# Patient Record
Sex: Female | Born: 1940 | Race: White | Hispanic: No | Marital: Married | Smoking: Never smoker
Health system: Southern US, Community
[De-identification: ages and names within clinical notes are randomized; demographics above are authoritative.]

## PROBLEM LIST (undated history)

## (undated) DIAGNOSIS — E559 Vitamin D deficiency, unspecified: Secondary | ICD-10-CM

## (undated) DIAGNOSIS — L409 Psoriasis, unspecified: Secondary | ICD-10-CM

## (undated) DIAGNOSIS — I499 Cardiac arrhythmia, unspecified: Secondary | ICD-10-CM

## (undated) DIAGNOSIS — T7840XA Allergy, unspecified, initial encounter: Secondary | ICD-10-CM

## (undated) DIAGNOSIS — I10 Essential (primary) hypertension: Secondary | ICD-10-CM

## (undated) DIAGNOSIS — M1990 Unspecified osteoarthritis, unspecified site: Secondary | ICD-10-CM

## (undated) DIAGNOSIS — I4891 Unspecified atrial fibrillation: Secondary | ICD-10-CM

## (undated) DIAGNOSIS — E785 Hyperlipidemia, unspecified: Secondary | ICD-10-CM

## (undated) DIAGNOSIS — Z950 Presence of cardiac pacemaker: Secondary | ICD-10-CM

## (undated) HISTORY — DX: Presence of cardiac pacemaker: Z95.0

## (undated) HISTORY — DX: Psoriasis, unspecified: L40.9

## (undated) HISTORY — DX: Hyperlipidemia, unspecified: E78.5

## (undated) HISTORY — DX: Allergy, unspecified, initial encounter: T78.40XA

## (undated) HISTORY — PX: APPENDECTOMY: SHX54

## (undated) HISTORY — DX: Unspecified atrial fibrillation: I48.91

## (undated) HISTORY — DX: Cardiac arrhythmia, unspecified: I49.9

## (undated) HISTORY — PX: TONSILLECTOMY: SUR1361

## (undated) HISTORY — DX: Essential (primary) hypertension: I10

## (undated) HISTORY — PX: TUBAL LIGATION: SHX77

## (undated) HISTORY — DX: Vitamin D deficiency, unspecified: E55.9

## (undated) HISTORY — DX: Unspecified osteoarthritis, unspecified site: M19.90

---

## 2018-06-16 DIAGNOSIS — Z950 Presence of cardiac pacemaker: Secondary | ICD-10-CM | POA: Insufficient documentation

## 2019-10-04 ENCOUNTER — Other Ambulatory Visit: Payer: Self-pay

## 2019-10-04 ENCOUNTER — Other Ambulatory Visit: Payer: Self-pay | Admitting: Orthopaedic Surgery

## 2019-10-04 ENCOUNTER — Encounter: Payer: Self-pay | Admitting: Orthopaedic Surgery

## 2019-10-04 ENCOUNTER — Ambulatory Visit: Payer: Medicare HMO | Admitting: Orthopaedic Surgery

## 2019-10-04 ENCOUNTER — Ambulatory Visit: Payer: Medicare HMO

## 2019-10-04 VITALS — BP 148/80 | HR 64 | Ht 67.0 in | Wt 173.0 lb

## 2019-10-04 DIAGNOSIS — I4811 Longstanding persistent atrial fibrillation: Secondary | ICD-10-CM | POA: Diagnosis not present

## 2019-10-04 DIAGNOSIS — Z7901 Long term (current) use of anticoagulants: Secondary | ICD-10-CM | POA: Diagnosis not present

## 2019-10-04 DIAGNOSIS — I4891 Unspecified atrial fibrillation: Secondary | ICD-10-CM | POA: Insufficient documentation

## 2019-10-04 DIAGNOSIS — Z950 Presence of cardiac pacemaker: Secondary | ICD-10-CM

## 2019-10-04 DIAGNOSIS — M25562 Pain in left knee: Secondary | ICD-10-CM

## 2019-10-04 DIAGNOSIS — G8929 Other chronic pain: Secondary | ICD-10-CM

## 2019-10-04 NOTE — Progress Notes (Signed)
Subjective:    Patient ID: Sheri Watts, female    DOB: 1940-08-18, 79 y.o.   MRN: [id number]  HPI She has had pain in the left knee getting much worse over the last few months.  She has swelling and pain.  She has feeling of giving way.  Her pain is medial.  She is in a wheelchair today because it is so hard for her to walk about.  She has no trauma, no redness.  I have read her notes from [place].    She says nothing has helped her left knee.  She has tried ice, Tylenol and heat.  She cannot take NSAIDs as she is anticoagulated for atrial fib.  She has a pacemaker as well.  She is concerned she is not getting any better.   Review of Systems  Constitutional: Positive for activity change.  Respiratory: Positive for shortness of breath.   Cardiovascular: Positive for chest pain and palpitations.  Endocrine: Positive for cold intolerance and heat intolerance.  Musculoskeletal: Positive for arthralgias, gait problem and joint swelling.  Allergic/Immunologic: Positive for environmental allergies.  All other systems reviewed and are negative.  For Review of Systems, all other systems reviewed and are negative.  The following is a summary of the past history medically, past history surgically, known current medicines, social history and family history.  This information is gathered electronically by the computer from prior information and documentation.  I review this each visit and have found including this information at this point in the chart is beneficial and informative.   Past Medical History:  Diagnosis Date  . Allergies   . Arthritis   . Atrial fibrillation ([place])   . Cardiac arrhythmia   . Hyperlipemia   . Hypertension   . Pacemaker   . Psoriasis   . Vitamin D deficiency     Past Surgical History:  Procedure Laterality Date  . APPENDECTOMY    . TONSILLECTOMY    . TUBAL LIGATION      Current Outpatient Medications on File Prior to Visit  Medication  Sig Dispense Refill  . apixaban (ELIQUIS) 5 MG TABS tablet Take 5 mg by mouth 2 (two) times daily.    Marland Kitchen atorvastatin (LIPITOR) 40 MG tablet Take by mouth.    . Calcium Carb-Cholecalciferol 600-800 MG-UNIT CHEW Chew by mouth.    . Coenzyme Q10 200 MG capsule Take by mouth.    . diclofenac Sodium (VOLTAREN) 1 % GEL     . lisinopril (ZESTRIL) 10 MG tablet Take 10 mg by mouth daily.    Marland Kitchen loratadine (CLARITIN) 10 MG tablet Take 10 mg by mouth daily.    . Multiple Vitamins-Minerals (MULTIPLE VITAMINS/WOMENS PO) Take by mouth daily.     No current facility-administered medications on file prior to visit.    Social History   Socioeconomic History  . Marital status: Single    Spouse name: Not on file  . Number of children: Not on file  . Years of education: Not on file  . Highest education level: Not on file  Occupational History  . Not on file  Tobacco Use  . Smoking status: Never Smoker  . Smokeless tobacco: Never Used  Substance and Sexual Activity  . Alcohol use: Never  . Drug use: Never  . Sexual activity: Not on file  Other Topics Concern  . Not on file  Social History Narrative  . Not on file   Social Determinants of Health   Financial Resource  Strain:   . Difficulty of Paying Living Expenses:   Food Insecurity:   . Worried About Charity fundraiser in the Last Year:   . Arboriculturist in the Last Year:   Transportation Needs:   . Film/video editor (Medical):   Marland Kitchen Lack of Transportation (Non-Medical):   Physical Activity:   . Days of Exercise per Week:   . Minutes of Exercise per Session:   Stress:   . Feeling of Stress :   Social Connections:   . Frequency of Communication with Friends and Family:   . Frequency of Social Gatherings with Friends and Family:   . Attends Religious Services:   . Active Member of Clubs or Organizations:   . Attends Archivist Meetings:   Marland Kitchen Marital Status:   Intimate Partner Violence:   . Fear of Current or  Ex-Partner:   . Emotionally Abused:   Marland Kitchen Physically Abused:   . Sexually Abused:     Family History  Problem Relation Age of Onset  . Leukemia Mother   . Pleurisy Father   . Rheum arthritis Sister   . Kidney failure Sister     BP (!) 148/80   Pulse 64   Ht 5\' 7"  (1.702 m)   Wt 173 lb (78.5 kg)   BMI 27.10 kg/m   Body mass index is 27.1 kg/m.     Objective:   Physical Exam Vitals and nursing note reviewed.  Constitutional:      Appearance: She is well-developed.  HENT:     Head: Normocephalic and atraumatic.  Eyes:     Conjunctiva/sclera: Conjunctivae normal.     Pupils: Pupils are equal, round, and reactive to light.  Cardiovascular:     Rate and Rhythm: Normal rate and regular rhythm.  Pulmonary:     Effort: Pulmonary effort is normal.  Abdominal:     Palpations: Abdomen is soft.  Musculoskeletal:     Cervical back: Normal range of motion and neck supple.       Legs:  Skin:    General: Skin is warm and dry.  Neurological:     Mental Status: She is alert and oriented to person, place, and time.     Cranial Nerves: No cranial nerve deficit.     Motor: No abnormal muscle tone.     Coordination: Coordination normal.     Deep Tendon Reflexes: Reflexes are normal and symmetric. Reflexes normal.  Psychiatric:        Behavior: Behavior normal.        Thought Content: Thought content normal.        Judgment: Judgment normal.    X-rays were done of the left knee, reported separately.       Assessment & Plan:   Encounter Diagnoses  Name Primary?  . Chronic pain of left knee Yes  . Pacemaker   . Longstanding persistent atrial fibrillation ([place])   . Current use of long term anticoagulation    PROCEDURE NOTE:  The patient requests injections of the left knee , verbal consent was obtained.  The left knee was prepped appropriately after time out was performed.   Sterile technique was observed and injection of 1 cc of Depo-Medrol 40 mg with several cc's  of plain xylocaine. Anesthesia was provided by ethyl chloride and a 20-gauge needle was used to inject the knee area. The injection was tolerated well.  A band aid dressing was applied.  The patient was advised to apply  ice later today and tomorrow to the injection sight as needed.  I am concerned about medial meniscus tear.  She is wearing down the medial knee with narrowing and DJD present.  I will see her in two weeks.  She may need CT arthrogram of the left knee.  Call if any problem.  Precautions discussed.   Electronically Signed Sanjuana Kava, MD 6/8/20211:26 PM

## 2019-10-18 ENCOUNTER — Encounter: Payer: Self-pay | Admitting: Orthopaedic Surgery

## 2019-10-18 ENCOUNTER — Other Ambulatory Visit: Payer: Self-pay

## 2019-10-18 ENCOUNTER — Ambulatory Visit (INDEPENDENT_AMBULATORY_CARE_PROVIDER_SITE_OTHER): Payer: Medicare HMO | Admitting: Orthopaedic Surgery

## 2019-10-18 VITALS — BP 148/62 | HR 71 | Ht 67.0 in | Wt 173.0 lb

## 2019-10-18 DIAGNOSIS — G8929 Other chronic pain: Secondary | ICD-10-CM

## 2019-10-18 DIAGNOSIS — M25562 Pain in left knee: Secondary | ICD-10-CM | POA: Diagnosis not present

## 2019-10-18 DIAGNOSIS — Z7901 Long term (current) use of anticoagulants: Secondary | ICD-10-CM

## 2019-10-18 DIAGNOSIS — I4811 Longstanding persistent atrial fibrillation: Secondary | ICD-10-CM

## 2019-10-18 DIAGNOSIS — Z950 Presence of cardiac pacemaker: Secondary | ICD-10-CM

## 2019-10-18 NOTE — Progress Notes (Signed)
PROCEDURE NOTE:  The patient requests injections of the left knee , verbal consent was obtained.  The left knee was prepped appropriately after time out was performed.   Sterile technique was observed and injection of 1 cc of Depo-Medrol 40 mg with several cc's of plain xylocaine. Anesthesia was provided by ethyl chloride and a 20-gauge needle was used to inject the knee area. The injection was tolerated well.  A band aid dressing was applied.  The patient was advised to apply ice later today and tomorrow to the injection sight as needed.  I will get MRI of the left knee at [facility] where it has unit that allows her type of pacemaker to have MRI.  Return in two weeks.  Call if any problem.  Precautions discussed.   Electronically Signed Sanjuana Kava, MD 6/22/202110:18 AM

## 2019-10-18 NOTE — Addendum Note (Signed)
Addended by: Derek Mound A on: 10/18/2019 10:21 AM   Modules accepted: Orders

## 2019-11-03 ENCOUNTER — Ambulatory Visit: Payer: Medicare HMO | Admitting: Orthopaedic Surgery

## 2019-11-15 ENCOUNTER — Ambulatory Visit (HOSPITAL_COMMUNITY)
Admission: RE | Admit: 2019-11-15 | Discharge: 2019-11-15 | Disposition: A | Discharge status: Home / Self Care | Payer: Medicare HMO | Source: Ambulatory Visit | Attending: Orthopaedic Surgery | Admitting: Orthopaedic Surgery

## 2019-11-15 ENCOUNTER — Ambulatory Visit (HOSPITAL_COMMUNITY)
Admission: RE | Admit: 2019-11-15 | Discharge: 2019-11-15 | Disposition: A | Discharge status: Home / Self Care | Payer: Medicare HMO | Source: Ambulatory Visit | Attending: Student | Admitting: Student

## 2019-11-15 ENCOUNTER — Other Ambulatory Visit: Payer: Self-pay

## 2019-11-15 DIAGNOSIS — G8929 Other chronic pain: Secondary | ICD-10-CM | POA: Insufficient documentation

## 2019-11-15 DIAGNOSIS — Z950 Presence of cardiac pacemaker: Secondary | ICD-10-CM | POA: Diagnosis present

## 2019-11-15 DIAGNOSIS — M25562 Pain in left knee: Secondary | ICD-10-CM | POA: Diagnosis not present

## 2019-11-15 NOTE — Progress Notes (Signed)
Per order:  Pt will not require any changes prior to MRI. (Discussed personally with [employer]).    [employer] Rep will walk RN through clearing device post MRI.

## 2019-11-15 NOTE — Progress Notes (Signed)
Informed of MRI for today.   Device system confirmed to be MRI conditional, with implant date > 6 weeks ago and no evidence of abandoned or epicardial leads in review of most recent CXR Interrogation from today reviewed, pt is currently AP-VS at ~60 bpm  Pt will not require any changes prior to MRI. (Discussed personally with [employer]).   [employer] Rep will walk RN through clearing device post MRI.   Shirley Friar, PA-C  11/15/2019 1:35 PM

## 2019-11-17 ENCOUNTER — Other Ambulatory Visit: Payer: Self-pay

## 2019-11-17 ENCOUNTER — Encounter: Payer: Self-pay | Admitting: Orthopaedic Surgery

## 2019-11-17 ENCOUNTER — Ambulatory Visit: Payer: Medicare HMO | Admitting: Orthopaedic Surgery

## 2019-11-17 VITALS — Ht 67.0 in | Wt 173.0 lb

## 2019-11-17 DIAGNOSIS — M1712 Unilateral primary osteoarthritis, left knee: Secondary | ICD-10-CM

## 2019-11-17 DIAGNOSIS — Z950 Presence of cardiac pacemaker: Secondary | ICD-10-CM | POA: Diagnosis not present

## 2019-11-17 DIAGNOSIS — G8929 Other chronic pain: Secondary | ICD-10-CM

## 2019-11-17 DIAGNOSIS — I4811 Longstanding persistent atrial fibrillation: Secondary | ICD-10-CM | POA: Diagnosis not present

## 2019-11-17 DIAGNOSIS — Z7901 Long term (current) use of anticoagulants: Secondary | ICD-10-CM

## 2019-11-17 NOTE — Progress Notes (Signed)
Patient CW:UGQBV Sheri Watts, female DOB:09-Jul-1940, 79 y.o. QXI:[id number]  Chief Complaint  Patient presents with  . Knee Pain    Lt knee    HPI  Emonnie Watts is a 79 y.o. female who has left knee pain.  She had a MRI which showed: IMPRESSION: 1. 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 No acute osseous findings. 2. Meniscal degeneration with free edge fraying and partial peripheral extrusion of the medial meniscus from the joint. Intact meniscal root without centrally displaced meniscal fragment. 3. The lateral meniscus, cruciate and collateral ligaments are intact. 4. Small mildly complex joint effusion and moderate-sized Baker'scyst.  I have explained the findings to her.  I would not recommend arthroscopy.  She is a candidate for a total knee.  She will think about this.  She has less pain since the injection.   Body mass index is 27.1 kg/m.  ROS  Review of Systems  Constitutional: Positive for activity change.  Respiratory: Positive for shortness of breath.   Cardiovascular: Positive for chest pain and palpitations.  Endocrine: Positive for cold intolerance and heat intolerance.  Musculoskeletal: Positive for arthralgias, gait problem and joint swelling.  Allergic/Immunologic: Positive for environmental allergies.  All other systems reviewed and are negative.   All other systems reviewed and are negative.  The following is a summary of the past history medically, past history surgically, known current medicines, social history and family history.  This information is gathered electronically by the computer from prior information and documentation.  I review this each visit and have found including this information at this point in the chart is beneficial and informative.    Past Medical History:  Diagnosis Date  . Allergies   . Arthritis   . Atrial fibrillation ([place])   . Cardiac arrhythmia   . Hyperlipemia   . Hypertension   .  Pacemaker   . Psoriasis   . Vitamin D deficiency     Past Surgical History:  Procedure Laterality Date  . APPENDECTOMY    . TONSILLECTOMY    . TUBAL LIGATION      Family History  Problem Relation Age of Onset  . Leukemia Mother   . Pleurisy Father   . Rheum arthritis Sister   . Kidney failure Sister     Social History Social History   Tobacco Use  . Smoking status: Never Smoker  . Smokeless tobacco: Never Used  Vaping Use  . Vaping Use: Never used  Substance Use Topics  . Alcohol use: Never  . Drug use: Never    Allergies  Allergen Reactions  . Cephalexin   . Ciprofloxacin   . Doxycycline Monohydrate   . Smz-Tmp Ds [Sulfamethoxazole-Trimethoprim]     Current Outpatient Medications  Medication Sig Dispense Refill  . apixaban (ELIQUIS) 5 MG TABS tablet Take 5 mg by mouth 2 (two) times daily.    Marland Kitchen atorvastatin (LIPITOR) 40 MG tablet Take by mouth.    . Calcium Carb-Cholecalciferol 600-800 MG-UNIT CHEW Chew by mouth.    . Coenzyme Q10 200 MG capsule Take by mouth.    . diclofenac Sodium (VOLTAREN) 1 % GEL     . lisinopril (ZESTRIL) 10 MG tablet Take 10 mg by mouth daily.    . Multiple Vitamins-Minerals (MULTIPLE VITAMINS/WOMENS PO) Take by mouth daily.    Marland Kitchen loratadine (CLARITIN) 10 MG tablet Take 10 mg by mouth daily. (Patient not taking: Reported on 11/17/2019)     No current facility-administered medications for this visit.  Physical Exam  Height 5\' 7"  (1.702 m), weight 173 lb (78.5 kg).  Constitutional: overall normal hygiene, normal nutrition, well developed, normal grooming, normal body habitus. Assistive device:walker  Musculoskeletal: gait and station Limp left, muscle tone and strength are normal, no tremors or atrophy is present.  .  Neurological: coordination overall normal.  Deep tendon reflex/nerve stretch intact.  Sensation normal.  Cranial nerves II-XII intact.   Skin:   Normal overall no scars, lesions, ulcers or rashes. No  psoriasis.  Psychiatric: Alert and oriented x 3.  Recent memory intact, remote memory unclear.  Normal mood and affect. Well groomed.  Good eye contact.  Cardiovascular: overall no swelling, no varicosities, no edema bilaterally, normal temperatures of the legs and arms, no clubbing, cyanosis and good capillary refill.  Lymphatic: palpation is normal.  Left knee with effusion, crepitus, ROM 0 to 105, positive Medial [place], NV intact.  All other systems reviewed and are negative   The patient has been educated about the nature of the problem(s) and counseled on treatment options.  The patient appeared to understand what I have discussed and is in agreement with it.  Encounter Diagnoses  Name Primary?  . Chronic pain of left knee Yes  . Pacemaker   . Longstanding persistent atrial fibrillation ([place])   . Current use of long term anticoagulation     PLAN Call if any problems.  Precautions discussed.  Continue current medications.   Return to clinic 1 month   Electronically Signed Sanjuana Kava, MD 7/22/202111:09 AM

## 2019-12-15 ENCOUNTER — Encounter: Payer: Self-pay | Admitting: Orthopaedic Surgery

## 2019-12-15 ENCOUNTER — Other Ambulatory Visit: Payer: Self-pay

## 2019-12-15 ENCOUNTER — Ambulatory Visit (INDEPENDENT_AMBULATORY_CARE_PROVIDER_SITE_OTHER): Payer: Medicare HMO | Admitting: Orthopaedic Surgery

## 2019-12-15 VITALS — BP 151/59 | HR 63 | Ht 67.0 in | Wt 173.2 lb

## 2019-12-15 DIAGNOSIS — Z7901 Long term (current) use of anticoagulants: Secondary | ICD-10-CM | POA: Diagnosis not present

## 2019-12-15 DIAGNOSIS — G8929 Other chronic pain: Secondary | ICD-10-CM

## 2019-12-15 DIAGNOSIS — I4811 Longstanding persistent atrial fibrillation: Secondary | ICD-10-CM

## 2019-12-15 DIAGNOSIS — Z950 Presence of cardiac pacemaker: Secondary | ICD-10-CM

## 2019-12-15 DIAGNOSIS — M25562 Pain in left knee: Secondary | ICD-10-CM | POA: Diagnosis not present

## 2019-12-15 NOTE — Progress Notes (Signed)
Patient [name], female DOB:[birth year], 79 y.o. PIR:[id number]  Chief Complaint  Patient presents with  . Knee Pain    L/doing good/walking alot bothers it    HPI  Sheri Watts is a 79 y.o. female who has significant DJD of the left knee.  She is on anticoagulation.  I cannot give NSAIDs.  She is using a walker.  The injection did help and she would prefer not to consider a total knee at this time.  I have encouraged her to be as active as possible.  I can see her as needed to consider another injection as needed.   Body mass index is 27.13 kg/m.  ROS  Review of Systems  Constitutional: Positive for activity change.  Respiratory: Positive for shortness of breath.   Cardiovascular: Positive for chest pain and palpitations.  Endocrine: Positive for cold intolerance and heat intolerance.  Musculoskeletal: Positive for arthralgias, gait problem and joint swelling.  Allergic/Immunologic: Positive for environmental allergies.  All other systems reviewed and are negative.   All other systems reviewed and are negative.  The following is a summary of the past history medically, past history surgically, known current medicines, social history and family history.  This information is gathered electronically by the computer from prior information and documentation.  I review this each visit and have found including this information at this point in the chart is beneficial and informative.    Past Medical History:  Diagnosis Date  . Allergies   . Arthritis   . Atrial fibrillation ([place])   . Cardiac arrhythmia   . Hyperlipemia   . Hypertension   . Pacemaker   . Psoriasis   . Vitamin D deficiency     Past Surgical History:  Procedure Laterality Date  . APPENDECTOMY    . TONSILLECTOMY    . TUBAL LIGATION      Family History  Problem Relation Age of Onset  . Leukemia Mother   . Pleurisy Father   . Rheum arthritis Sister   . Kidney failure Sister     Social  History Social History   Tobacco Use  . Smoking status: Never Smoker  . Smokeless tobacco: Never Used  Vaping Use  . Vaping Use: Never used  Substance Use Topics  . Alcohol use: Never  . Drug use: Never    Allergies  Allergen Reactions  . Cephalexin   . Ciprofloxacin   . Doxycycline Monohydrate   . Smz-Tmp Ds [Sulfamethoxazole-Trimethoprim]     Current Outpatient Medications  Medication Sig Dispense Refill  . apixaban (ELIQUIS) 5 MG TABS tablet Take 5 mg by mouth 2 (two) times daily.    Marland Kitchen atorvastatin (LIPITOR) 40 MG tablet Take by mouth.    . Calcium Carb-Cholecalciferol 600-800 MG-UNIT CHEW Chew by mouth.    . Coenzyme Q10 200 MG capsule Take by mouth.    . diclofenac Sodium (VOLTAREN) 1 % GEL     . lisinopril (ZESTRIL) 10 MG tablet Take 10 mg by mouth daily.    Marland Kitchen loratadine (CLARITIN) 10 MG tablet Take 10 mg by mouth daily.     . Multiple Vitamins-Minerals (MULTIPLE VITAMINS/WOMENS PO) Take by mouth daily.     No current facility-administered medications for this visit.     Physical Exam  Blood pressure (!) 151/59, pulse 63, height 5\' 7"  (1.702 m), weight 173 lb 4 oz (78.6 kg).  Constitutional: overall normal hygiene, normal nutrition, well developed, normal grooming, normal body habitus. Assistive device:walker  Musculoskeletal: gait and station Limp  left, muscle tone and strength are normal, no tremors or atrophy is present.  .  Neurological: coordination overall normal.  Deep tendon reflex/nerve stretch intact.  Sensation normal.  Cranial nerves II-XII intact.   Skin:   Normal overall no scars, lesions, ulcers or rashes. No psoriasis.  Psychiatric: Alert and oriented x 3.  Recent memory intact, remote memory unclear.  Normal mood and affect. Well groomed.  Good eye contact.  Cardiovascular: overall no swelling, no varicosities, no edema bilaterally, normal temperatures of the legs and arms, no clubbing, cyanosis and good capillary refill.  Lymphatic:  palpation is normal.  Left knee has effusion, crepitus, ROM 0 to 100, diffusely tender, NV intact.  All other systems reviewed and are negative   The patient has been educated about the nature of the problem(s) and counseled on treatment options.  The patient appeared to understand what I have discussed and is in agreement with it.  Encounter Diagnoses  Name Primary?  . Chronic pain of left knee Yes  . Pacemaker   . Longstanding persistent atrial fibrillation ([place])   . Current use of long term anticoagulation     PLAN Call if any problems.  Precautions discussed.  Continue current medications.   Return to clinic prn   Electronically Signed Sanjuana Kava, MD 8/19/20219:19 AM

## 2020-01-17 NOTE — H&P (Signed)
Surgical History & Physical  Patient Name: Sheri Watts DOB: Nov 02, 1940  Surgery: Cataract extraction with intraocular lens implant phacoemulsification; Left Eye  Surgeon: Baruch Goldmann MD Surgery Date:  01/27/2020 Pre-Op Date:  01/17/2020  HPI: A 79 Yr. old female patient Pt present for cataract evaluation. The patient complains of difficulty when driving due to glare from headlights or sun, which began 3 months ago. Patient states she struggles with distance and near. This is negatively affecting the patient's quality of life. Wearing glasses full time. Both eyes are affected. The episode is constant. Symptoms occur when the patient is driving and reading. The condition is better in dim light. The patient experiences no flashes and no increase floaters. HPI was performed by Baruch Goldmann .  Medical History: Dry Eyes Cataracts Heart Problem High Blood Pressure High cholesterol  Review of Systems Cardiovascular Pacemaker/defibrillator Musculoskeletal Joint Ache, Stiffness, knees All recorded systems are negative except as noted above.  Social   Never smoked   Medication Eliquis, Calcium, Fish Oil, CoQ10, Atorvastatin, Lisinopril, Diclofenac Sodium, Multivitamin,   Sx/Procedures Heart Sx, Tubal Ligation,   Drug Allergies  Ciprofloxacin, Smz/mpds, CEPHALEXIN, Doxyclicline,   History & Physical: Heent:  Cataract, Left eye NECK: supple without bruits LUNGS: lungs clear to auscultation CV: regular rate and rhythm Abdomen: soft and non-tender   Impression & Plan: Assessment: 1.  COMBINED FORMS AGE RELATED CATARACT; Both Eyes (H25.813) 2.  BLEPHARITIS; Right Upper Lid, Right Lower Lid, Left Upper Lid, Left Lower Lid (H01.001, H01.002,H01.004,H01.005) 3.  DERMATOCHALASIS, no surgery; Right Upper Lid, Left Upper Lid (H02.831, H02.834) 4.  ASTIGMATISM, REGULAR; Both Eyes (H52.223)  Plan: 1.  Cataract accounts for the patient's decreased vision. This visual impairment is not  correctable with a tolerable change in glasses or contact lenses. Cataract surgery with an implantation of a new lens should significantly improve the visual and functional status of the patient. Discussed all risks, benefits, alternatives, and potential complications. Discussed the procedures and recovery. Patient desires to have surgery. A-scan ordered and performed today for intra-ocular lens calculations. The surgery will be performed in order to improve vision for driving, reading, and for eye examinations. Recommend phacoemulsification with intra-ocular lens. Recommend Dextenza for post-operative pain and inflammation. Left Eye worse - first. Dilates well - shugarcaine by protocol. Consider Toric Lens. 2.  Begin/continue lid scrubs. 3.  Asymptomatic, recommend observation for now. Findings, prognosis and treatment options reviewed. 4.  Consider toric lens as above.

## 2020-01-25 ENCOUNTER — Encounter (HOSPITAL_COMMUNITY): Payer: Self-pay

## 2020-01-25 ENCOUNTER — Other Ambulatory Visit: Payer: Self-pay

## 2020-01-25 ENCOUNTER — Encounter (HOSPITAL_COMMUNITY)
Admission: RE | Admit: 2020-01-25 | Discharge: 2020-01-25 | Disposition: A | Discharge status: Home / Self Care | Payer: Medicare HMO | Source: Ambulatory Visit | Attending: Ophthalmology | Admitting: Ophthalmology

## 2020-01-25 ENCOUNTER — Other Ambulatory Visit (HOSPITAL_COMMUNITY)
Admission: RE | Admit: 2020-01-25 | Discharge: 2020-01-25 | Disposition: A | Discharge status: Home / Self Care | Payer: Medicare HMO | Source: Ambulatory Visit | Attending: Ophthalmology | Admitting: Ophthalmology

## 2020-01-25 DIAGNOSIS — Z01812 Encounter for preprocedural laboratory examination: Secondary | ICD-10-CM | POA: Diagnosis not present

## 2020-01-25 DIAGNOSIS — Z20822 Contact with and (suspected) exposure to covid-19: Secondary | ICD-10-CM | POA: Insufficient documentation

## 2020-01-25 LAB — SARS CORONAVIRUS 2 (TAT 6-24 HRS): SARS Coronavirus 2: NEGATIVE

## 2020-01-27 ENCOUNTER — Encounter (HOSPITAL_COMMUNITY)
Admission: RE | Disposition: A | Discharge status: Home / Self Care | Payer: Self-pay | Source: Home / Self Care | Attending: Ophthalmology

## 2020-01-27 ENCOUNTER — Ambulatory Visit (HOSPITAL_COMMUNITY): Payer: Medicare HMO | Admitting: Anesthesiology

## 2020-01-27 ENCOUNTER — Ambulatory Visit (HOSPITAL_COMMUNITY)
Admission: RE | Admit: 2020-01-27 | Discharge: 2020-01-27 | Disposition: A | Discharge status: Home / Self Care | Payer: Medicare HMO | Attending: Ophthalmology | Admitting: Ophthalmology

## 2020-01-27 ENCOUNTER — Encounter (HOSPITAL_COMMUNITY): Payer: Self-pay | Admitting: Ophthalmology

## 2020-01-27 ENCOUNTER — Other Ambulatory Visit: Payer: Self-pay

## 2020-01-27 DIAGNOSIS — I4891 Unspecified atrial fibrillation: Secondary | ICD-10-CM | POA: Insufficient documentation

## 2020-01-27 DIAGNOSIS — H0100A Unspecified blepharitis right eye, upper and lower eyelids: Secondary | ICD-10-CM | POA: Insufficient documentation

## 2020-01-27 DIAGNOSIS — H0100B Unspecified blepharitis left eye, upper and lower eyelids: Secondary | ICD-10-CM | POA: Diagnosis not present

## 2020-01-27 DIAGNOSIS — Z7901 Long term (current) use of anticoagulants: Secondary | ICD-10-CM | POA: Insufficient documentation

## 2020-01-27 DIAGNOSIS — H52223 Regular astigmatism, bilateral: Secondary | ICD-10-CM | POA: Diagnosis not present

## 2020-01-27 DIAGNOSIS — E7800 Pure hypercholesterolemia, unspecified: Secondary | ICD-10-CM | POA: Insufficient documentation

## 2020-01-27 DIAGNOSIS — Z79899 Other long term (current) drug therapy: Secondary | ICD-10-CM | POA: Insufficient documentation

## 2020-01-27 DIAGNOSIS — H02834 Dermatochalasis of left upper eyelid: Secondary | ICD-10-CM | POA: Insufficient documentation

## 2020-01-27 DIAGNOSIS — Z95810 Presence of automatic (implantable) cardiac defibrillator: Secondary | ICD-10-CM | POA: Insufficient documentation

## 2020-01-27 DIAGNOSIS — H02831 Dermatochalasis of right upper eyelid: Secondary | ICD-10-CM | POA: Insufficient documentation

## 2020-01-27 DIAGNOSIS — I10 Essential (primary) hypertension: Secondary | ICD-10-CM | POA: Insufficient documentation

## 2020-01-27 DIAGNOSIS — H25813 Combined forms of age-related cataract, bilateral: Secondary | ICD-10-CM | POA: Diagnosis not present

## 2020-01-27 HISTORY — PX: CATARACT EXTRACTION W/PHACO: SHX586

## 2020-01-27 SURGERY — PHACOEMULSIFICATION, CATARACT, WITH IOL INSERTION
Anesthesia: Monitor Anesthesia Care | Site: Eye | Laterality: Left

## 2020-01-27 MED ORDER — EPINEPHRINE PF 1 MG/ML IJ SOLN
INTRAOCULAR | Status: DC | PRN
  Administered 2020-01-27: 500 mL

## 2020-01-27 MED ORDER — MIDAZOLAM HCL 5 MG/5ML IJ SOLN
INTRAMUSCULAR | Status: DC | PRN
  Administered 2020-01-27: .5 mg via INTRAVENOUS

## 2020-01-27 MED ORDER — POVIDONE-IODINE 5 % OP SOLN
OPHTHALMIC | Status: DC | PRN
  Administered 2020-01-27: 1 via OPHTHALMIC

## 2020-01-27 MED ORDER — LACTATED RINGERS IV SOLN: INTRAVENOUS | Status: DC

## 2020-01-27 MED ORDER — CYCLOPENTOLATE-PHENYLEPHRINE 0.2-1 % OP SOLN
1.0000 [drp] | OPHTHALMIC | Status: AC | PRN
  Administered 2020-01-27 (×3): 1 [drp] via OPHTHALMIC

## 2020-01-27 MED ORDER — BSS IO SOLN
INTRAOCULAR | Status: DC | PRN
  Administered 2020-01-27: 15 mL via INTRAOCULAR

## 2020-01-27 MED ORDER — MIDAZOLAM HCL 2 MG/2ML IJ SOLN
INTRAMUSCULAR | Status: AC
  Filled 2020-01-27: qty 2

## 2020-01-27 MED ORDER — PHENYLEPHRINE HCL 2.5 % OP SOLN
1.0000 [drp] | OPHTHALMIC | Status: AC | PRN
  Administered 2020-01-27 (×3): 1 [drp] via OPHTHALMIC

## 2020-01-27 MED ORDER — EPINEPHRINE PF 1 MG/ML IJ SOLN
INTRAMUSCULAR | Status: AC
  Filled 2020-01-27: qty 2

## 2020-01-27 MED ORDER — PROVISC 10 MG/ML IO SOLN
INTRAOCULAR | Status: DC | PRN
  Administered 2020-01-27: 0.85 mL via INTRAOCULAR

## 2020-01-27 MED ORDER — TETRACAINE HCL 0.5 % OP SOLN
1.0000 [drp] | OPHTHALMIC | Status: AC | PRN
  Administered 2020-01-27 (×3): 1 [drp] via OPHTHALMIC

## 2020-01-27 MED ORDER — SODIUM HYALURONATE 23 MG/ML IO SOLN
INTRAOCULAR | Status: DC | PRN
  Administered 2020-01-27: 0.6 mL via INTRAOCULAR

## 2020-01-27 MED ORDER — LIDOCAINE HCL (PF) 1 % IJ SOLN
INTRAOCULAR | Status: DC | PRN
  Administered 2020-01-27: 1 mL via OPHTHALMIC

## 2020-01-27 MED ORDER — LIDOCAINE HCL 3.5 % OP GEL
1.0000 "application " | Freq: Once | OPHTHALMIC | Status: AC
  Administered 2020-01-27: 1 via OPHTHALMIC

## 2020-01-27 SURGICAL SUPPLY — 14 items
CLOTH BEACON ORANGE TIMEOUT ST (SAFETY) ×3 IMPLANT
EYE SHIELD UNIVERSAL CLEAR (GAUZE/BANDAGES/DRESSINGS) ×3 IMPLANT
GLOVE BIOGEL PI IND STRL 6.5 (GLOVE) ×1 IMPLANT
GLOVE BIOGEL PI IND STRL 7.0 (GLOVE) ×1 IMPLANT
GLOVE BIOGEL PI INDICATOR 6.5 (GLOVE) ×2
GLOVE BIOGEL PI INDICATOR 7.0 (GLOVE) ×2
LENS ALC ACRYL/TECN (Ophthalmic Related) ×3 IMPLANT
NEEDLE HYPO 18GX1.5 BLUNT FILL (NEEDLE) ×3 IMPLANT
PAD ARMBOARD 7.5X6 YLW CONV (MISCELLANEOUS) ×3 IMPLANT
SYR TB 1ML LL NO SAFETY (SYRINGE) ×3 IMPLANT
TAPE SURG TRANSPORE 1 IN (GAUZE/BANDAGES/DRESSINGS) ×1 IMPLANT
TAPE SURGICAL TRANSPORE 1 IN (GAUZE/BANDAGES/DRESSINGS) ×2
VISCOELASTIC ADDITIONAL (OPHTHALMIC RELATED) ×3 IMPLANT
WATER STERILE IRR 250ML POUR (IV SOLUTION) ×3 IMPLANT

## 2020-01-27 NOTE — Anesthesia Postprocedure Evaluation (Signed)
Anesthesia Post Note  Patient: Sheri Watts  Procedure(s) Performed: CATARACT EXTRACTION PHACO AND INTRAOCULAR LENS PLACEMENT LEFT EYE (Left Eye)  Patient location during evaluation: Phase II Anesthesia Type: MAC Level of consciousness: awake, oriented, awake and alert and patient cooperative Pain management: pain level controlled Vital Signs Assessment: post-procedure vital signs reviewed and stable Respiratory status: spontaneous breathing, respiratory function stable and nonlabored ventilation Cardiovascular status: blood pressure returned to baseline and stable Postop Assessment: no headache and no backache Anesthetic complications: no   No complications documented.   Last Vitals:  Vitals:   01/27/20 0916  BP: (!) 158/57  Pulse: 62  Resp: 13  Temp: 36.6 C  SpO2: 98%    Last Pain:  Vitals:   01/27/20 0916  TempSrc: Oral  PainSc: 0-No pain                 Tacy Learn

## 2020-01-27 NOTE — Discharge Instructions (Signed)
Please discharge patient when stable, will follow up today with Dr. Mileidy Atkin at the [facility] [place] office immediately following discharge.  Leave shield in place until visit.  All paperwork with discharge instructions will be given at the office.  [facility] Address:  [address]  , [place] [address]             Monitored Anesthesia Care, Care After These instructions provide you with information about caring for yourself after your procedure. Your health care provider may also give you more specific instructions. Your treatment has been planned according to current medical practices, but problems sometimes occur. Call your health care provider if you have any problems or questions after your procedure. What can I expect after the procedure? After your procedure, you may:  Feel sleepy for several hours.  Feel clumsy and have poor balance for several hours.  Feel forgetful about what happened after the procedure.  Have poor judgment for several hours.  Feel nauseous or vomit.  Have a sore throat if you had a breathing tube during the procedure. Follow these instructions at home: For at least 24 hours after the procedure:      Have a responsible adult stay with you. It is important to have someone help care for you until you are awake and alert.  Rest as needed.  Do not: ? Participate in activities in which you could fall or become injured. ? Drive. ? Use heavy machinery. ? Drink alcohol. ? Take sleeping pills or medicines that cause drowsiness. ? Make important decisions or sign legal documents. ? Take care of children on your own. Eating and drinking  Follow the diet that is recommended by your health care provider.  If you vomit, drink water, juice, or soup when you can drink without vomiting.  Make sure you have little or no nausea before eating solid foods. General instructions  Take over-the-counter and  prescription medicines only as told by your health care provider.  If you have sleep apnea, surgery and certain medicines can increase your risk for breathing problems. Follow instructions from your health care provider about wearing your sleep device: ? Anytime you are sleeping, including during daytime naps. ? While taking prescription pain medicines, sleeping medicines, or medicines that make you drowsy.  If you smoke, do not smoke without supervision.  Keep all follow-up visits as told by your health care provider. This is important. Contact a health care provider if:  You keep feeling nauseous or you keep vomiting.  You feel light-headed.  You develop a rash.  You have a fever. Get help right away if:  You have trouble breathing. Summary  For several hours after your procedure, you may feel sleepy and have poor judgment.  Have a responsible adult stay with you for at least 24 hours or until you are awake and alert. This information is not intended to replace advice given to you by your health care provider. Make sure you discuss any questions you have with your health care provider. Document Revised: 07/13/2017 Document Reviewed: 08/05/2015 Elsevier Patient Education  2020 [employer]  

## 2020-01-27 NOTE — Interval H&P Note (Signed)
History and Physical Interval Note:  01/27/2020 9:44 AM  Sheri Watts  has presented today for surgery, with the diagnosis of Nuclear sclerotic cataract - Left eye.  The various methods of treatment have been discussed with the patient and family. After consideration of risks, benefits and other options for treatment, the patient has consented to  Procedure(s) with comments: CATARACT EXTRACTION PHACO AND INTRAOCULAR LENS PLACEMENT LEFT EYE (Left) - left as a surgical intervention.  The patient's history has been reviewed, patient examined, no change in status, stable for surgery.  I have reviewed the patient's chart and labs.  Questions were answered to the patient's satisfaction.     Baruch Goldmann

## 2020-01-27 NOTE — Transfer of Care (Signed)
Immediate Anesthesia Transfer of Care Note  Patient: Sheri Watts  Procedure(s) Performed: CATARACT EXTRACTION PHACO AND INTRAOCULAR LENS PLACEMENT LEFT EYE (Left Eye)  Patient Location: Short Stay  Anesthesia Type:MAC  Level of Consciousness: awake, alert , oriented and patient cooperative  Airway & Oxygen Therapy: Patient Spontanous Breathing  Post-op Assessment: Report given to RN, Post -op Vital signs reviewed and stable and Patient moving all extremities  Post vital signs: Reviewed and stable  Last Vitals:  Vitals Value Taken Time  BP    Temp    Pulse    Resp    SpO2      Last Pain:  Vitals:   01/27/20 0916  TempSrc: Oral  PainSc: 0-No pain         Complications: No complications documented.

## 2020-01-27 NOTE — Anesthesia Preprocedure Evaluation (Signed)
Anesthesia Evaluation  Patient identified by MRN, date of birth, ID band Patient awake    Reviewed: Allergy & Precautions, NPO status , Patient's Chart, lab work & pertinent test results  History of Anesthesia Complications (+) history of anesthetic complications  Airway Mallampati: II  TM Distance: >3 FB Neck ROM: Full    Dental  (+) Edentulous Upper, Edentulous Lower   Pulmonary neg pulmonary ROS,    Pulmonary exam normal breath sounds clear to auscultation       Cardiovascular Exercise Tolerance: Good hypertension, Pt. on medications Normal cardiovascular exam+ dysrhythmias Atrial Fibrillation + pacemaker  Rhythm:Regular Rate:Normal     Neuro/Psych negative neurological ROS  negative psych ROS   GI/Hepatic negative GI ROS, Neg liver ROS,   Endo/Other  negative endocrine ROS  Renal/GU negative Renal ROS     Musculoskeletal  (+) Arthritis ,   Abdominal   Peds  Hematology negative hematology ROS (+)   Anesthesia Other Findings   Reproductive/Obstetrics                             Anesthesia Physical Anesthesia Plan  ASA: III  Anesthesia Plan: MAC   Post-op Pain Management:    Induction:   PONV Risk Score and Plan:   Airway Management Planned: Nasal Cannula and Natural Airway  Additional Equipment:   Intra-op Plan:   Post-operative Plan:   Informed Consent: I have reviewed the patients History and Physical, chart, labs and discussed the procedure including the risks, benefits and alternatives for the proposed anesthesia with the patient or authorized representative who has indicated his/her understanding and acceptance.       Plan Discussed with: Surgeon and CRNA  Anesthesia Plan Comments:         Anesthesia Quick Evaluation

## 2020-01-27 NOTE — Op Note (Signed)
Date of procedure: 01/27/20  Pre-operative diagnosis: Visually significant age-related combined cataract, Left Eye (H25.812)  Post-operative diagnosis: Visually significant age-related combined cataract, Left Eye (H25.812)  Procedure: Removal of cataract via phacoemulsification and insertion of intra-ocular lens Wynetta Emery and Cameron  +18.5D into the capsular bag of the Left Eye  Attending surgeon: Gerda Diss. Eliyahu Bille, MD, MA  Anesthesia: MAC, Topical Akten  Complications: None  Estimated Blood Loss: <27m (minimal)  Specimens: None  Implants: As above  Indications:  Visually significant age-related cataract, Left Eye  Procedure:  The patient was seen and identified in the pre-operative area. The operative eye was identified and dilated.  The operative eye was marked.  Topical anesthesia was administered to the operative eye.     The patient was then to the operative suite and placed in the supine position.  A timeout was performed confirming the patient, procedure to be performed, and all other relevant information.   The patient's face was prepped and draped in the usual fashion for intra-ocular surgery.  A lid speculum was placed into the operative eye and the surgical microscope moved into place and focused.  An inferotemporal paracentesis was created using a 20 gauge paracentesis blade.  Shugarcaine was injected into the anterior chamber.  Viscoelastic was injected into the anterior chamber.  A temporal clear-corneal main wound incision was created using a 2.478mmicrokeratome.  A continuous curvilinear capsulorrhexis was initiated using an irrigating cystitome and completed using capsulorrhexis forceps.  Hydrodissection and hydrodeliniation were performed.  Viscoelastic was injected into the anterior chamber.  A phacoemulsification handpiece and a chopper as a second instrument were used to remove the nucleus and epinucleus. The irrigation/aspiration handpiece was used to remove  any remaining cortical material.   The capsular bag was reinflated with viscoelastic, checked, and found to be intact.  The intraocular lens was inserted into the capsular bag.  The irrigation/aspiration handpiece was used to remove any remaining viscoelastic.  The clear corneal wound and paracentesis wounds were then hydrated and checked with Weck-Cels to be watertight.  The lid-speculum was removed.  The drape was removed.  The patient's face was cleaned with a wet and dry 4x4. A clear shield was taped over the eye. The patient was taken to the post-operative care unit in good condition, having tolerated the procedure well.  Post-Op Instructions: [facility] and will receive all other orders and instructions.

## 2020-01-30 ENCOUNTER — Encounter (HOSPITAL_COMMUNITY): Payer: Self-pay | Admitting: Ophthalmology

## 2020-02-14 NOTE — H&P (Signed)
Surgical History & Physical  Patient Name: Sheri Watts DOB: 30-Oct-1940  Surgery: Cataract extraction with intraocular lens implant phacoemulsification; Right Eye  Surgeon: Baruch Goldmann MD Surgery Date:  02/24/2020 Pre-Op Date:  02/09/2020  HPI: [name] The patient is returning after cataract post-op. The left eye is affected. Status post cataract post-op, which began 1 weeks ago: Since the last visit, the affected area feels improvement and is doing well. The patient's vision is improved and stable. Patient is following medication instructions. Omni BID OS. The patient experiences no flashes and no increase floaters. The patient complains of difficulty when driving due to glare from headlights or sun, which began 3 months ago. Patient states she struggles with distance and near. This is negatively affecting the patient's quality of life. The right eye is affected. The episode is constant. Symptoms occur when the patient is driving and reading. HPI was performed by Baruch Goldmann .  Medical History: Cataracts Dry Eyes Heart Problem High Blood Pressure High cholesterol  Review of Systems Cardiovascular Pacemaker/defibrillator Musculoskeletal Joint Ache, Stiffness, knees All recorded systems are negative except as noted above.  Social   Never smoked   Medication Prednisolone-gatiflox-bromfenac,  Eliquis, Calcium, Fish Oil, CoQ10, Atorvastatin, Lisinopril, Diclofenac Sodium, Multivitamin,   Sx/Procedures Phaco c IOL OS,  Heart Sx, Tubal Ligation,   Drug Allergies  Ciprofloxacin, Smz/mpds, CEPHALEXIN, Doxyclicline,   History & Physical: Heent:  Cataract, Right eye NECK: supple without bruits LUNGS: lungs clear to auscultation CV: regular rate and rhythm Abdomen: soft and non-tender  Impression & Plan: Assessment: 1.  CATARACT EXTRACTION STATUS; Left Eye (Z98.42) 2.  COMBINED FORMS AGE RELATED CATARACT; Both Eyes (H25.813) 3.  INTRAOCULAR LENS IOL  (Z96.1)  Plan: 1.  1 week after cataract surgery. Doing well with improved vision and normal eye pressure. Call with any problems or concerns. Continue Gati-Brom-Pred 2x/day for 3 more weeks. 2.  Dilates well - shugarcaine by protocol. Consider Toric Lens. Cataract accounts for the patient's decreased vision. This visual impairment is not correctable with a tolerable change in glasses or contact lenses. Cataract surgery with an implantation of a new lens should significantly improve the visual and functional status of the patient. Discussed all risks, benefits, alternatives, and potential complications. Discussed the procedures and recovery. Patient desires to have surgery. A-scan ordered and performed today for intra-ocular lens calculations. The surgery will be performed in order to improve vision for driving, reading, and for eye examinations. Recommend phacoemulsification with intra-ocular lens. Recommend Dextenza for post-operative pain and inflammation. Right Eye. Surgery required to correct imbalance of vision. 3.  Stable. Doing well since surgery Continue Post-op medications

## 2020-02-21 ENCOUNTER — Other Ambulatory Visit: Payer: Self-pay

## 2020-02-21 ENCOUNTER — Encounter (HOSPITAL_COMMUNITY)
Admission: RE | Admit: 2020-02-21 | Discharge: 2020-02-21 | Disposition: A | Discharge status: Home / Self Care | Payer: Medicare HMO | Source: Ambulatory Visit | Attending: Ophthalmology | Admitting: Ophthalmology

## 2020-02-21 ENCOUNTER — Encounter (HOSPITAL_COMMUNITY): Payer: Self-pay

## 2020-02-22 ENCOUNTER — Other Ambulatory Visit: Payer: Self-pay

## 2020-02-22 ENCOUNTER — Other Ambulatory Visit (HOSPITAL_COMMUNITY)
Admission: RE | Admit: 2020-02-22 | Discharge: 2020-02-22 | Disposition: A | Discharge status: Home / Self Care | Payer: Medicare HMO | Source: Ambulatory Visit | Attending: Ophthalmology | Admitting: Ophthalmology

## 2020-02-22 DIAGNOSIS — Z01812 Encounter for preprocedural laboratory examination: Secondary | ICD-10-CM | POA: Diagnosis present

## 2020-02-22 DIAGNOSIS — Z20822 Contact with and (suspected) exposure to covid-19: Secondary | ICD-10-CM | POA: Insufficient documentation

## 2020-02-22 LAB — SARS CORONAVIRUS 2 (TAT 6-24 HRS): SARS Coronavirus 2: NEGATIVE

## 2020-02-24 ENCOUNTER — Encounter (HOSPITAL_COMMUNITY): Payer: Self-pay | Admitting: Ophthalmology

## 2020-02-24 ENCOUNTER — Ambulatory Visit (HOSPITAL_COMMUNITY): Payer: Medicare HMO | Admitting: Anesthesiology

## 2020-02-24 ENCOUNTER — Encounter (HOSPITAL_COMMUNITY)
Admission: RE | Disposition: A | Discharge status: Home / Self Care | Payer: Self-pay | Source: Home / Self Care | Attending: Ophthalmology

## 2020-02-24 ENCOUNTER — Ambulatory Visit (HOSPITAL_COMMUNITY)
Admission: RE | Admit: 2020-02-24 | Discharge: 2020-02-24 | Disposition: A | Discharge status: Home / Self Care | Payer: Medicare HMO | Attending: Ophthalmology | Admitting: Ophthalmology

## 2020-02-24 DIAGNOSIS — H25811 Combined forms of age-related cataract, right eye: Secondary | ICD-10-CM | POA: Diagnosis not present

## 2020-02-24 HISTORY — PX: CATARACT EXTRACTION W/PHACO: SHX586

## 2020-02-24 SURGERY — PHACOEMULSIFICATION, CATARACT, WITH IOL INSERTION
Anesthesia: Monitor Anesthesia Care | Site: Eye | Laterality: Right

## 2020-02-24 MED ORDER — PHENYLEPHRINE HCL 2.5 % OP SOLN
1.0000 [drp] | OPHTHALMIC | Status: AC | PRN
  Administered 2020-02-24 (×3): 1 [drp] via OPHTHALMIC

## 2020-02-24 MED ORDER — LIDOCAINE HCL 3.5 % OP GEL
1.0000 "application " | Freq: Once | OPHTHALMIC | Status: AC
  Administered 2020-02-24: 1 via OPHTHALMIC

## 2020-02-24 MED ORDER — BSS IO SOLN
INTRAOCULAR | Status: DC | PRN
  Administered 2020-02-24: 15 mL via INTRAOCULAR

## 2020-02-24 MED ORDER — EPINEPHRINE PF 1 MG/ML IJ SOLN
INTRAOCULAR | Status: DC | PRN
  Administered 2020-02-24: 500 mL

## 2020-02-24 MED ORDER — EPINEPHRINE PF 1 MG/ML IJ SOLN
INTRAMUSCULAR | Status: AC
  Filled 2020-02-24: qty 2

## 2020-02-24 MED ORDER — LIDOCAINE HCL (PF) 1 % IJ SOLN
INTRAOCULAR | Status: DC | PRN
  Administered 2020-02-24: 1 mL via OPHTHALMIC

## 2020-02-24 MED ORDER — CYCLOPENTOLATE-PHENYLEPHRINE 0.2-1 % OP SOLN
1.0000 [drp] | OPHTHALMIC | Status: AC | PRN
  Administered 2020-02-24 (×3): 1 [drp] via OPHTHALMIC

## 2020-02-24 MED ORDER — PROVISC 10 MG/ML IO SOLN
INTRAOCULAR | Status: DC | PRN
  Administered 2020-02-24: 0.85 mL via INTRAOCULAR

## 2020-02-24 MED ORDER — SODIUM HYALURONATE 23 MG/ML IO SOLN
INTRAOCULAR | Status: DC | PRN
  Administered 2020-02-24: 0.6 mL via INTRAOCULAR

## 2020-02-24 MED ORDER — TETRACAINE HCL 0.5 % OP SOLN
1.0000 [drp] | OPHTHALMIC | Status: AC | PRN
  Administered 2020-02-24 (×3): 1 [drp] via OPHTHALMIC

## 2020-02-24 MED ORDER — POVIDONE-IODINE 5 % OP SOLN
OPHTHALMIC | Status: DC | PRN
  Administered 2020-02-24: 1 via OPHTHALMIC

## 2020-02-24 SURGICAL SUPPLY — 14 items
CLOTH BEACON ORANGE TIMEOUT ST (SAFETY) ×3 IMPLANT
EYE SHIELD UNIVERSAL CLEAR (GAUZE/BANDAGES/DRESSINGS) ×3 IMPLANT
GLOVE BIOGEL PI IND STRL 7.0 (GLOVE) ×3 IMPLANT
GLOVE BIOGEL PI INDICATOR 7.0 (GLOVE) ×6
GLOVE SURG SS PI 7.5 STRL IVOR (GLOVE) ×3 IMPLANT
GOWN STRL REUS W/ TWL XL LVL3 (GOWN DISPOSABLE) ×1 IMPLANT
GOWN STRL REUS W/TWL XL LVL3 (GOWN DISPOSABLE) ×3
NEEDLE HYPO 18GX1.5 BLUNT FILL (NEEDLE) ×3 IMPLANT
PAD ARMBOARD 7.5X6 YLW CONV (MISCELLANEOUS) ×3 IMPLANT
SYR TB 1ML LL NO SAFETY (SYRINGE) ×3 IMPLANT
TAPE PAPER 2X10 WHT MICROPORE (GAUZE/BANDAGES/DRESSINGS) ×3 IMPLANT
TECNIS IOL (Intraocular Lens) ×3 IMPLANT
VISCOELASTIC ADDITIONAL (OPHTHALMIC RELATED) IMPLANT
WATER STERILE IRR 250ML POUR (IV SOLUTION) ×3 IMPLANT

## 2020-02-24 NOTE — Op Note (Signed)
Date of procedure: 02/24/20  Pre-operative diagnosis:  Visually significant combined form age-related cataract, Right Eye (H25.811)  Post-operative diagnosis:  Visually significant combined form age-related cataract, Right Eye (H25.811)  Procedure: Removal of cataract via phacoemulsification and insertion of intra-ocular lens Wynetta Emery and [employer] [id number]  +19.0D into the capsular bag of the Right Eye  Attending surgeon: Gerda Diss. Clarie Camey, MD, MA  Anesthesia: MAC, Topical Akten  Complications: None  Estimated Blood Loss: <12m (minimal)  Specimens: None  Implants: As above  Indications:  Visually significant age-related cataract, Right Eye  Procedure:  The patient was seen and identified in the pre-operative area. The operative eye was identified and dilated.  The operative eye was marked.  Topical anesthesia was administered to the operative eye.     The patient was then to the operative suite and placed in the supine position.  A timeout was performed confirming the patient, procedure to be performed, and all other relevant information.   The patient's face was prepped and draped in the usual fashion for intra-ocular surgery.  A lid speculum was placed into the operative eye and the surgical microscope moved into place and focused.  A superotemporal paracentesis was created using a 20 gauge paracentesis blade.  Shugarcaine was injected into the anterior chamber.  Viscoelastic was injected into the anterior chamber.  A temporal clear-corneal main wound incision was created using a 2.419mmicrokeratome.  A continuous curvilinear capsulorrhexis was initiated using an irrigating cystitome and completed using capsulorrhexis forceps.  Hydrodissection and hydrodeliniation were performed.  Viscoelastic was injected into the anterior chamber.  A phacoemulsification handpiece and a chopper as a second instrument were used to remove the nucleus and epinucleus. The irrigation/aspiration handpiece was  used to remove any remaining cortical material.   The capsular bag was reinflated with viscoelastic, checked, and found to be intact.  The intraocular lens was inserted into the capsular bag.  The irrigation/aspiration handpiece was used to remove any remaining viscoelastic.  The clear corneal wound and paracentesis wounds were then hydrated and checked with Weck-Cels to be watertight.  The lid-speculum was removed.  The drape was removed.  The patient's face was cleaned with a wet and dry 4x4. A clear shield was taped over the eye. The patient was taken to the post-operative care unit in good condition, having tolerated the procedure well.  Post-Op Instructions: [facility] and will receive all other orders and instructions.

## 2020-02-24 NOTE — Discharge Instructions (Signed)
Please discharge patient when stable, will follow up today with Dr. Tyhir Schwan at the [facility] [place] office immediately following discharge.  Leave shield in place until visit.  All paperwork with discharge instructions will be given at the office.  [facility] Address:  [address]  [place], [place] [address]  

## 2020-02-24 NOTE — Transfer of Care (Signed)
Immediate Anesthesia Transfer of Care Note  Patient: Sheri Watts  Procedure(s) Performed: CATARACT EXTRACTION PHACO AND INTRAOCULAR LENS PLACEMENT RIGHT EYE (Right Eye)  Patient Location: Short Stay  Anesthesia Type:MAC  Level of Consciousness: awake, alert  and oriented  Airway & Oxygen Therapy: Patient Spontanous Breathing  Post-op Assessment: Report given to RN, Post -op Vital signs reviewed and stable and Patient moving all extremities X 4  Post vital signs: Reviewed and stable  Last Vitals:  Vitals Value Taken Time  BP    Temp    Pulse    Resp    SpO2      Last Pain:  Vitals:   02/24/20 0723  TempSrc: Oral  PainSc: 0-No pain      Patients Stated Pain Goal: 6 (03/18/61 4469)  Complications: No complications documented.

## 2020-02-24 NOTE — Interval H&P Note (Signed)
History and Physical Interval Note:  02/24/2020 8:28 AM  Sheri Watts  has presented today for surgery, with the diagnosis of Nuclear sclerotic cataract - Right eye.  The various methods of treatment have been discussed with the patient and family. After consideration of risks, benefits and other options for treatment, the patient has consented to  Procedure(s) with comments: CATARACT EXTRACTION PHACO AND INTRAOCULAR LENS PLACEMENT (IOC) (Right) - right as a surgical intervention.  The patient's history has been reviewed, patient examined, no change in status, stable for surgery.  I have reviewed the patient's chart and labs.  Questions were answered to the patient's satisfaction.     Baruch Goldmann

## 2020-02-24 NOTE — Anesthesia Preprocedure Evaluation (Signed)
Anesthesia Evaluation  Patient identified by MRN, date of birth, ID band Patient awake    Reviewed: Allergy & Precautions, NPO status , Patient's Chart, lab work & pertinent test results, reviewed documented beta blocker date and time   History of Anesthesia Complications Negative for: history of anesthetic complications  Airway Mallampati: II  TM Distance: >3 FB Neck ROM: Full    Dental no notable dental hx.    Pulmonary neg pulmonary ROS,    Pulmonary exam normal breath sounds clear to auscultation       Cardiovascular hypertension, Normal cardiovascular exam+ pacemaker  Rhythm:Regular Rate:Normal     Neuro/Psych negative neurological ROS     GI/Hepatic negative GI ROS, Neg liver ROS,   Endo/Other  negative endocrine ROS  Renal/GU negative Renal ROS     Musculoskeletal  (+) Arthritis , Osteoarthritis,    Abdominal   Peds  Hematology   Anesthesia Other Findings   Reproductive/Obstetrics                             Anesthesia Physical Anesthesia Plan  ASA: III  Anesthesia Plan: MAC   Post-op Pain Management:    Induction: Intravenous  PONV Risk Score and Plan:   Airway Management Planned: Nasal Cannula  Additional Equipment:   Intra-op Plan:   Post-operative Plan:   Informed Consent: I have reviewed the patients History and Physical, chart, labs and discussed the procedure including the risks, benefits and alternatives for the proposed anesthesia with the patient or authorized representative who has indicated his/her understanding and acceptance.     Dental advisory given  Plan Discussed with: CRNA  Anesthesia Plan Comments:         Anesthesia Quick Evaluation

## 2020-02-24 NOTE — Anesthesia Postprocedure Evaluation (Signed)
Anesthesia Post Note  Patient: Tarhonda Hollenberg  Procedure(s) Performed: CATARACT EXTRACTION PHACO AND INTRAOCULAR LENS PLACEMENT RIGHT EYE (Right Eye)  Patient location during evaluation: Phase II Anesthesia Type: MAC Level of consciousness: awake and alert and oriented Pain management: pain level controlled Vital Signs Assessment: post-procedure vital signs reviewed and stable Respiratory status: spontaneous breathing, nonlabored ventilation and respiratory function stable Cardiovascular status: blood pressure returned to baseline and stable Postop Assessment: no apparent nausea or vomiting Anesthetic complications: no   No complications documented.   Last Vitals:  Vitals:   02/24/20 0723  BP: (!) 164/65  Pulse: 62  Resp: 15  Temp: 36.5 C  SpO2: 100%    Last Pain:  Vitals:   02/24/20 0723  TempSrc: Oral  PainSc: 0-No pain                 Orlie Dakin

## 2020-02-24 NOTE — Anesthesia Procedure Notes (Signed)
Procedure Name: MAC Date/Time: 02/24/2020 8:35 AM Performed by: Orlie Dakin, CRNA Pre-anesthesia Checklist: Patient identified, Emergency Drugs available, Suction available and Patient being monitored Patient Re-evaluated:Patient Re-evaluated prior to induction Oxygen Delivery Method: Nasal cannula Placement Confirmation: positive ETCO2

## 2020-02-27 ENCOUNTER — Encounter (HOSPITAL_COMMUNITY): Payer: Self-pay | Admitting: Ophthalmology

## 2021-01-10 ENCOUNTER — Other Ambulatory Visit: Payer: Self-pay

## 2021-01-10 ENCOUNTER — Ambulatory Visit: Payer: Medicare HMO | Admitting: Dermatology

## 2021-01-10 DIAGNOSIS — D1800 Hemangioma unspecified site: Secondary | ICD-10-CM

## 2021-01-10 DIAGNOSIS — L720 Epidermal cyst: Secondary | ICD-10-CM | POA: Diagnosis not present

## 2021-01-10 DIAGNOSIS — L820 Inflamed seborrheic keratosis: Secondary | ICD-10-CM | POA: Diagnosis not present

## 2021-01-10 NOTE — Progress Notes (Signed)
   New Patient Visit  Subjective  Sheri Watts is a 80 y.o. female who presents for the following: New Patient (Initial Visit) (Patient here today as new patient for concerns with spots at left side of neck. Patient also has a few red spots at abdomen she questions. ).  The following portions of the chart were reviewed this encounter and updated as appropriate:   Tobacco  Allergies  Meds  Problems  Med Hx  Surg Hx  Fam Hx     Review of Systems:  No other skin or systemic complaints except as noted in HPI or Assessment and Plan.  Objective  Well appearing patient in no apparent distress; mood and affect are within normal limits.  A focused examination was performed including face, abdomen, back, neck , right wrist. Relevant physical exam findings are noted in the Assessment and Plan.  left neck and shoulder x 4 (4) Erythematous keratotic or waxy stuck-on papule or plaque.   right volar wrist Smooth white papule(s).    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  Discussed option for removal.  Patient may consider.  Declines at this time.  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 IRuthell Rummage, CMA, am acting as scribe for Sarina Ser, MD. Documentation: I have reviewed the above documentation for accuracy and  completeness, and I agree with the above.  Sarina Ser, MD

## 2021-01-10 NOTE — Patient Instructions (Addendum)
Seborrheic Keratosis  What causes seborrheic keratoses? Seborrheic keratoses are harmless, common skin growths that first appear during adult life.  As time goes by, more growths appear.  Some people may develop a large number of them.  Seborrheic keratoses appear on both covered and uncovered body parts.  They are not caused by sunlight.  The tendency to develop seborrheic keratoses can be inherited.  They vary in color from skin-colored to gray, brown, or even black.  They can be either smooth or have a rough, warty surface.   Seborrheic keratoses are superficial and look as if they were stuck on the skin.  Under the microscope this type of keratosis looks like layers upon layers of skin.  That is why at times the top layer may seem to fall off, but the rest of the growth remains and re-grows.    Treatment Seborrheic keratoses do not need to be treated, but can easily be removed in the office.  Seborrheic keratoses often cause symptoms when they rub on clothing or jewelry.  Lesions can be in the way of shaving.  If they become inflamed, they can cause itching, soreness, or burning.  Removal of a seborrheic keratosis can be accomplished by freezing, burning, or surgery. If any spot bleeds, scabs, or grows rapidly, please return to have it checked, as these can be an indication of a skin cancer.  Cryotherapy Aftercare  Wash gently with soap and water everyday.   Apply Vaseline and Band-Aid daily until healed.   If you have any questions or concerns for your doctor, please call our main line at [phone] and press option 4 to reach your doctor's medical assistant. If no one answers, please leave a voicemail as directed and we will return your call as soon as possible. Messages left after 4 pm will be answered the following business day.   You may also send [place] a message via [place]. We typically respond to MyChart messages within 1-2 business days.  For prescription refills, please ask your  pharmacy to contact our office. Our fax number is [phone].  If you have an urgent issue when the clinic is closed that cannot wait until the next business day, you can page your doctor at the number below.    Please note that while we do our best to be available for urgent issues outside of office hours, we are not available 24/7.   If you have an urgent issue and are unable to reach [place], you may choose to seek medical care at your doctor's office, retail clinic, urgent care center, or emergency room.  If you have a medical emergency, please immediately call 911 or go to the emergency department.  Pager Numbers  - Dr. Nehemiah Massed: [phone]  - Dr. Laurence Ferrari: [phone]  - Dr. Nicole Kindred: [phone]  In the event of inclement weather, please call our main line at [phone] for an update on the status of any delays or closures.  Dermatology Medication Tips: Please keep the boxes that topical medications come in in order to help keep track of the instructions about where and how to use these. Pharmacies typically print the medication instructions only on the boxes and not directly on the medication tubes.   If your medication is too expensive, please contact our office at [phone] option 4 or send [place] a message through [place].   We are unable to tell what your co-pay for medications will be in advance as this is different depending on your insurance coverage. However,  we may be able to find a substitute medication at lower cost or fill out paperwork to get insurance to cover a needed medication.   If a prior authorization is required to get your medication covered by your insurance company, please allow [place] 1-2 business days to complete this process.  Drug prices often vary depending on where the prescription is filled and some pharmacies may offer cheaper prices.  The website www.goodrx.com contains coupons for medications through different pharmacies. The prices here do not  account for what the cost may be with help from insurance (it may be cheaper with your insurance), but the website can give you the price if you did not use any insurance.  - You can print the associated coupon and take it with your prescription to the pharmacy.  - You may also stop by our office during regular business hours and pick up a GoodRx coupon card.  - If you need your prescription sent electronically to a different pharmacy, notify our office through [facility] or by phone at [phone] option 4.

## 2021-01-13 ENCOUNTER — Encounter: Payer: Self-pay | Admitting: Dermatology

## 2021-03-14 IMAGING — MR MR KNEE*L* W/O CM
4 of 6 series · 19 of 40 positions shown · non-contrast
Comparison: Radiographs 10/04/2019

CLINICAL DATA: Chronic knee pain since falling 6 or 7 months ago.
scanned according to Ugo Lassiter/[EDDY RODRIGO] safety guidelines without
complications.

EXAM:
MRI OF THE LEFT KNEE WITHOUT CONTRAST
TECHNIQUE: Multiplanar, multisequence MR imaging of the knee was performed. No
intravenous contrast was administered.

[Series 16: PD fat-sat · axial · 4.0mm · 0.33mm/px · z∈[-63,+77]mm · 8 of 29 slices shown (1 of 3)]
[im 1/29]
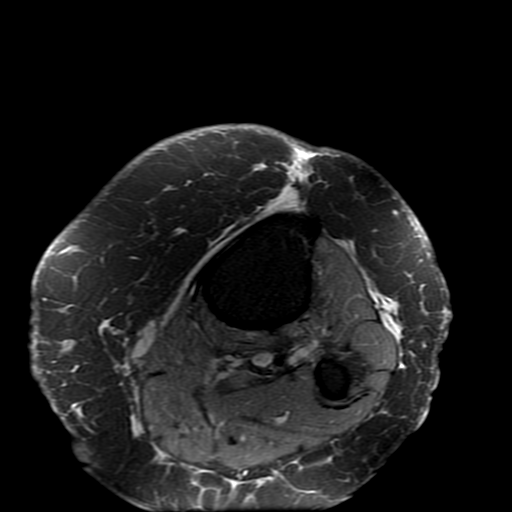
[im 5/29]
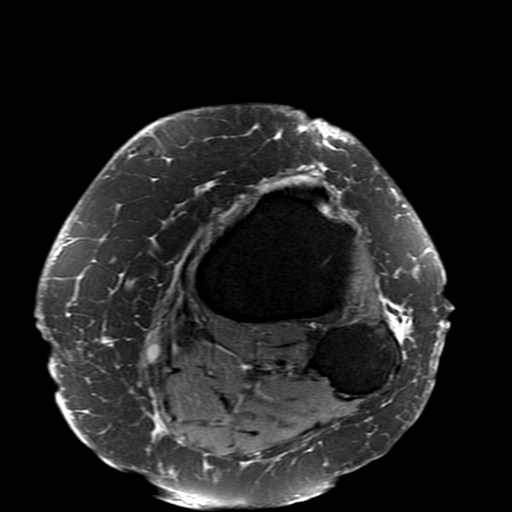
[im 9/29]
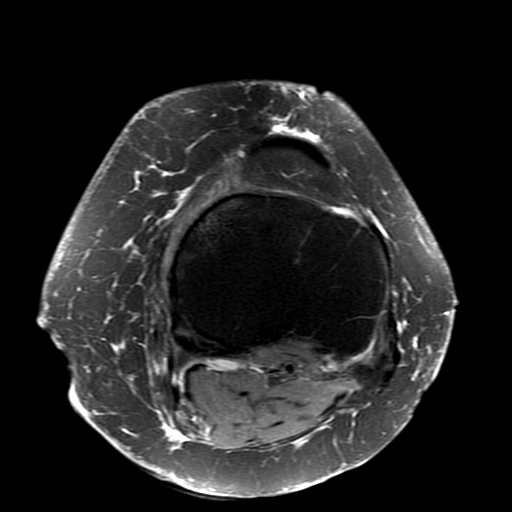
[im 13/29]
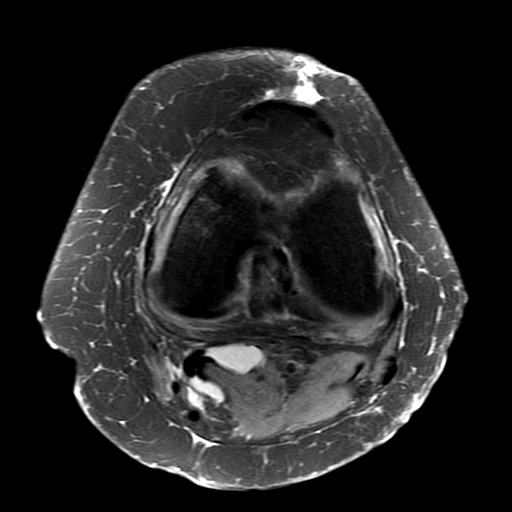
[im 17/29]
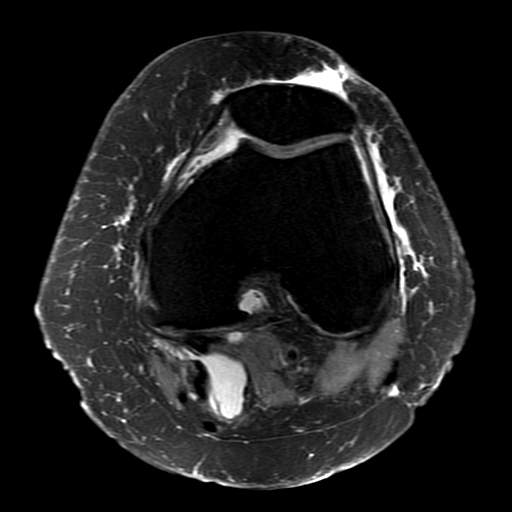
[im 21/29]
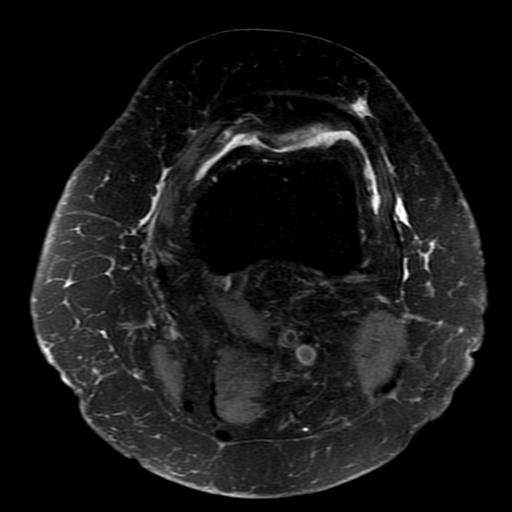
[im 25/29]
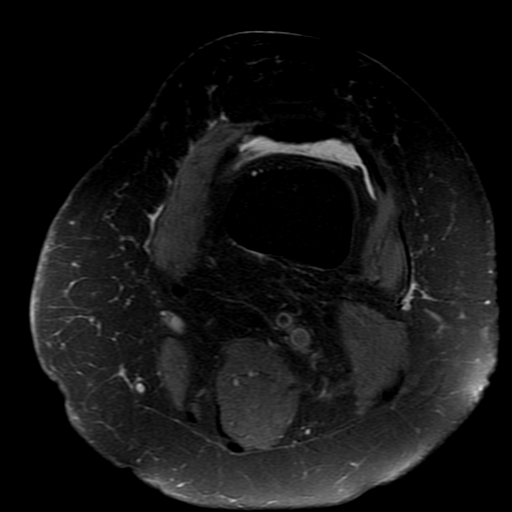
[im 29/29]
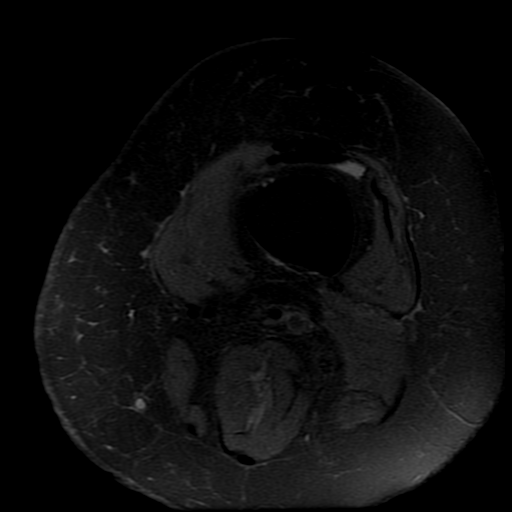

[Series 17: PD fat-sat · coronal · 4.0mm · 0.35mm/px · 5 of 22 slices shown (2 of 3)]
[im 1/22]
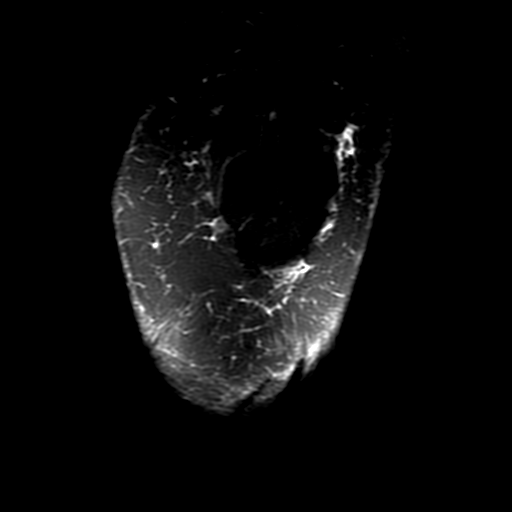
[im 5/22]
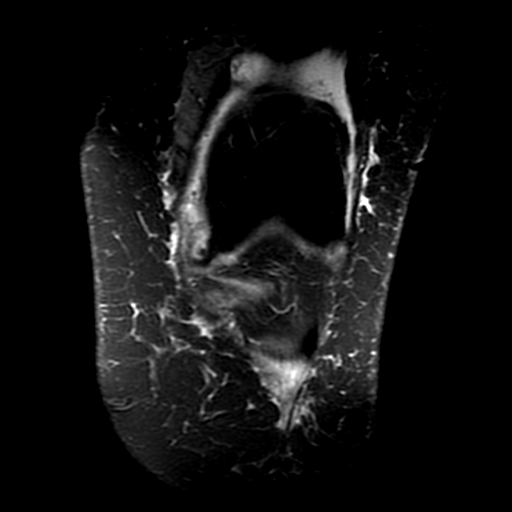
[im 9/22]
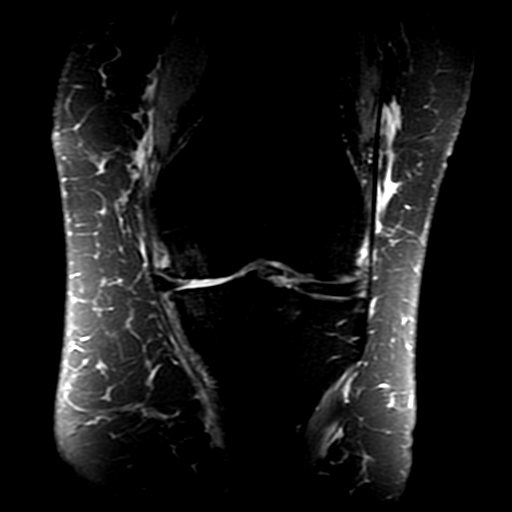
[im 13/22]
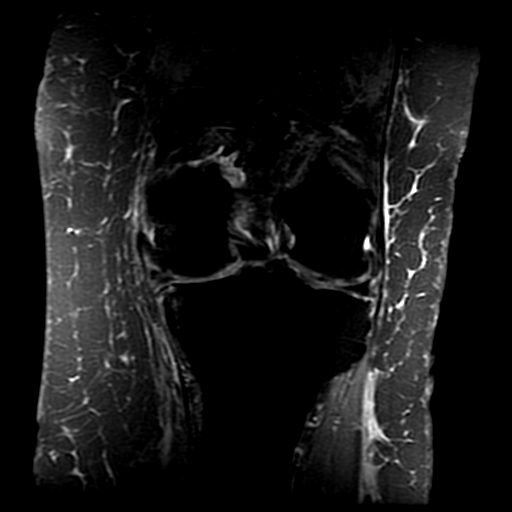
[im 22/22]
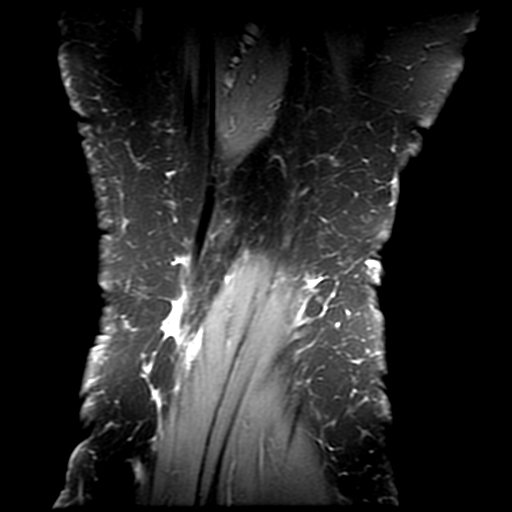

[Series 18: T2 fat-sat · coronal · 4.0mm · 0.35mm/px · 3 of 22 slices shown]
[im 5/22]
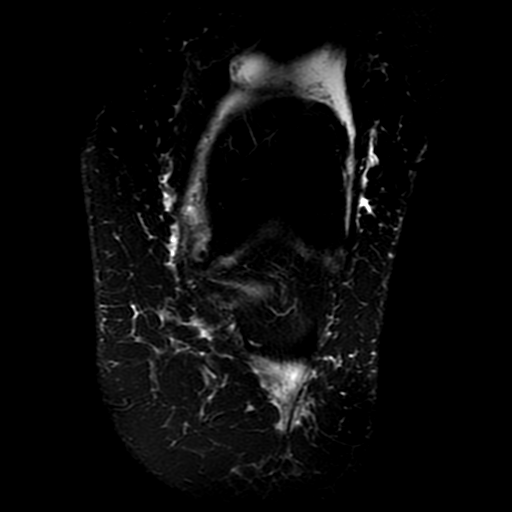
[im 13/22]
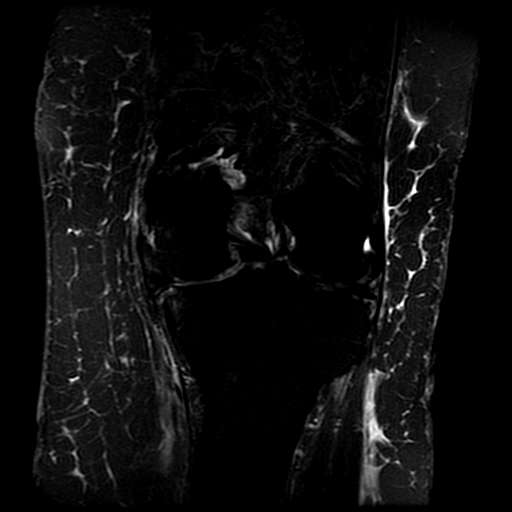
[im 22/22]
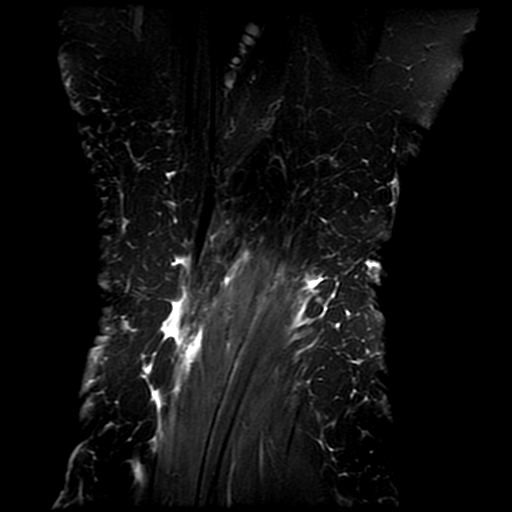

[Series 21: PD fat-sat · sagittal · 3.0mm · 0.33mm/px · 3 of 28 slices shown (3 of 3)]
[im 5/28]
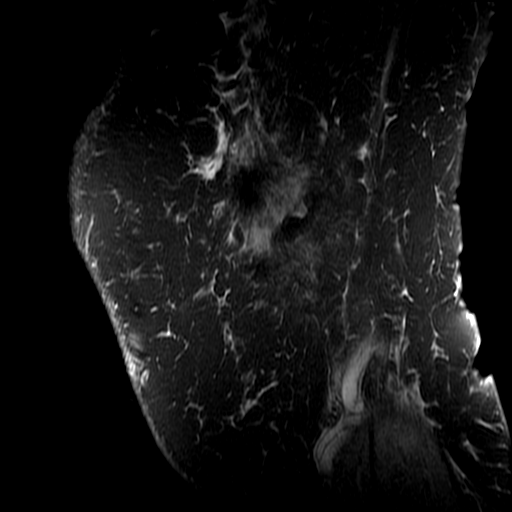
[im 14/28]
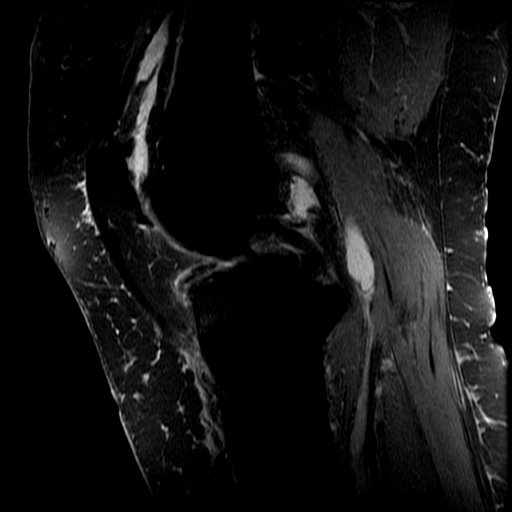
[im 23/28]
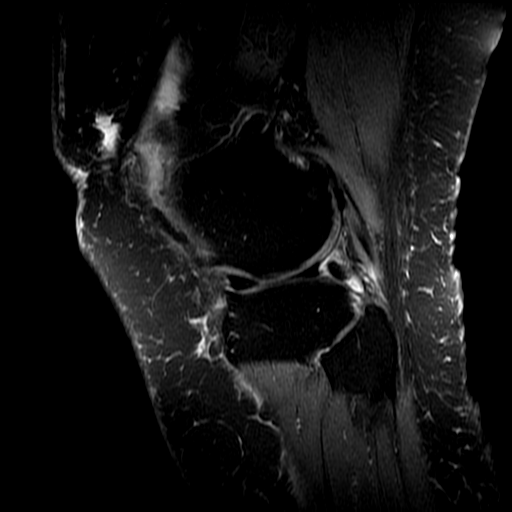

[19 of 40 positions shown; findings below may reference images not displayed]

FINDINGS: MENISCI

Medial meniscus: Meniscal degeneration with free edge fraying and
partial peripheral extrusion of the meniscus from the joint. The
meniscal root is intact, and no centrally displaced meniscal
fragment is seen.

Lateral meniscus: Mild meniscal degeneration without evidence of
tear.

LIGAMENTS

Cruciates:  Intact.

Collaterals:  Intact.

CARTILAGE

Patellofemoral: Mild patellofemoral degenerative changes with
chondral thinning, surface irregularity and osteophytes.

Medial: Advanced medial compartment osteoarthritis with diffuse
chondral thinning, osteophytes and subchondral sclerosis. There is
mild subchondral edema anteriorly.

Lateral:  Mild chondral thinning and osteophyte formation.

MISCELLANEOUS

Joint:  Small mildly complex joint effusion.

Popliteal Fossa:  Moderate-sized septated Baker's cyst.

Extensor Mechanism:  Intact.

Bones:  No acute or significant extra-articular osseous findings.

Other: Moderate prepatellar subcutaneous edema.
IMPRESSION: 1. Tricompartmental osteoarthritis, most advanced in the medial
compartment. No acute osseous findings.
2. Meniscal degeneration with free edge fraying and partial
peripheral extrusion of the medial meniscus from the joint. Intact
meniscal root without centrally displaced meniscal fragment.
3. The lateral meniscus, cruciate and collateral ligaments are
intact.
4. Small mildly complex joint effusion and moderate-sized Baker's
cyst.

## 2021-07-01 ENCOUNTER — Other Ambulatory Visit: Payer: Self-pay

## 2021-07-01 ENCOUNTER — Ambulatory Visit: Payer: Medicare HMO | Admitting: Dermatology

## 2021-07-01 DIAGNOSIS — L209 Atopic dermatitis, unspecified: Secondary | ICD-10-CM | POA: Diagnosis not present

## 2021-07-01 DIAGNOSIS — L409 Psoriasis, unspecified: Secondary | ICD-10-CM | POA: Diagnosis not present

## 2021-07-01 DIAGNOSIS — L718 Other rosacea: Secondary | ICD-10-CM

## 2021-07-01 DIAGNOSIS — L719 Rosacea, unspecified: Secondary | ICD-10-CM

## 2021-07-01 MED ORDER — EUCRISA 2 % EX OINT: 1.0000 "application " | TOPICAL_OINTMENT | Freq: Every day | CUTANEOUS | 3 refills | Status: DC

## 2021-07-01 MED ORDER — TRIAMCINOLONE ACETONIDE 0.1 % EX OINT
1.0000 | TOPICAL_OINTMENT | CUTANEOUS | 0 refills | Status: DC
Start: 2021-07-01 — End: 2021-08-15

## 2021-07-01 NOTE — Patient Instructions (Signed)

## 2021-07-01 NOTE — Progress Notes (Signed)
? ?  Follow-Up Visit ?  ?Subjective  ?Sheri Watts is a 81 y.o. female who presents for the following: Rash (Hands x 2-3 weeks - starts out like tiny blisters, hands get dry then become itchy). ?Also with redness face. ?Accompanied by husband ? ?The following portions of the chart were reviewed this encounter and updated as appropriate:  ? Tobacco  Allergies  Meds  Problems  Med Hx  Surg Hx  Fam Hx   ?  ?Review of Systems:  No other skin or systemic complaints except as noted in HPI or Assessment and Plan. ? ?Objective  ?Well appearing patient in no apparent distress; mood and affect are within normal limits. ? ?A focused examination was performed including hands/arms, legs/right foot. Relevant physical exam findings are noted in the Assessment and Plan. ? ?Left Knee - Anterior ?Pink scaly patch of left knee. Erythema, hyperkeratosis and peeling of hands. ? ?Head - Anterior (Face) ?Erythema and dilated blood vessels ? ? ?Assessment & Plan  ?Psoriasis with overlap of Atopic Dermatitis ?Left Knee; hands ? ?Start TMC 0.1% oint qhs to hands, left knee and right foot for 2 weeks then decrease to 5 times per week. ? ?Eucrisa oint qd to affected areas ? ?triamcinolone ointment (KENALOG) 0.1 % - Left Knee - Anterior ?Apply 1 application. topically as directed. Qhs to affected areas x 2 weeks then decrease to 5 times per week ? ?Crisaborole (EUCRISA) 2 % OINT - Left Knee - Anterior ?Apply 1 application. topically daily. ? ?Rosacea - erythrotelangiectatic type ?Head - Anterior (Face) ? ?Rosacea is a chronic progressive skin condition usually affecting the face of adults, causing redness and/or acne bumps. It is treatable but not curable. It sometimes affects the eyes (ocular rosacea) as well. It may respond to topical and/or systemic medication and can flare with stress, sun exposure, alcohol, exercise and some foods.  Daily application of broad spectrum spf 30+ sunscreen to face is recommended to reduce  flares. ? ?Discussed the treatment option of BBL/laser.  Typically we recommend 1-3 treatment sessions about 5-8 weeks apart for best results.  The patient's condition may require "maintenance treatments" in the future.  The fee for BBL / laser treatments is $350 per treatment session for the whole face.  A fee can be quoted for other parts of the body. ?Insurance typically does not pay for BBL/laser treatments and therefore the fee is an out-of-pocket cost. ? ?Return for 6-8 weeks, Follow up. ? ?I, Ashok Cordia, CMA, am acting as scribe for Sarina Ser, MD . ?Documentation: I have reviewed the above documentation for accuracy and completeness, and I agree with the above. ? ?Sarina Ser, MD ? ?

## 2021-07-02 ENCOUNTER — Telehealth: Payer: Self-pay

## 2021-07-02 ENCOUNTER — Encounter: Payer: Self-pay | Admitting: Dermatology

## 2021-07-02 NOTE — Telephone Encounter (Signed)
Pt called Sheri Watts is not covered by her insurance  ?

## 2021-07-04 ENCOUNTER — Other Ambulatory Visit: Payer: Self-pay

## 2021-07-04 MED ORDER — TACROLIMUS 0.1 % EX OINT: TOPICAL_OINTMENT | Freq: Two times a day (BID) | CUTANEOUS | 0 refills | Status: DC

## 2021-07-04 NOTE — Telephone Encounter (Signed)
Called pt discussed Use Triamcinolone for 2 weeks, then we will switch to:  ?Protopic oint qd - send in large tube.  ?Keep fu appt  ? ?Protopic oint erx'd to her pharmacy ?

## 2021-08-15 ENCOUNTER — Ambulatory Visit: Payer: Medicare HMO | Admitting: Dermatology

## 2021-08-15 DIAGNOSIS — L209 Atopic dermatitis, unspecified: Secondary | ICD-10-CM

## 2021-08-15 DIAGNOSIS — L409 Psoriasis, unspecified: Secondary | ICD-10-CM

## 2021-08-15 DIAGNOSIS — L719 Rosacea, unspecified: Secondary | ICD-10-CM

## 2021-08-15 MED ORDER — TACROLIMUS 0.1 % EX OINT: TOPICAL_OINTMENT | Freq: Every day | CUTANEOUS | 11 refills | Status: DC

## 2021-08-15 MED ORDER — TRIAMCINOLONE ACETONIDE 0.1 % EX OINT: 1.0000 "application " | TOPICAL_OINTMENT | CUTANEOUS | 11 refills | Status: DC

## 2021-08-15 NOTE — Progress Notes (Signed)
? ?  Follow-Up Visit ?  ?Subjective  ?Sheri Watts is a 81 y.o. female who presents for the following: Follow-up (Psoriasis with overlap of Atopic dermatits - has improved. Using TMC 0.1% qd prn flares, Tacrolimus oint qd prn). ? ?Accompanied by husband who contributes to history. ? ?The following portions of the chart were reviewed this encounter and updated as appropriate:  ? Tobacco  Allergies  Meds  Problems  Med Hx  Surg Hx  Fam Hx   ?  ?Review of Systems:  No other skin or systemic complaints except as noted in HPI or Assessment and Plan. ? ?Objective  ?Well appearing patient in no apparent distress; mood and affect are within normal limits. ? ?A focused examination was performed including face, hands. Relevant physical exam findings are noted in the Assessment and Plan. ? ?Hands ?Minimal patches of hands ? ?Head - Anterior (Face) ?Erythema and dilated blood vessels ? ? ?Assessment & Plan  ?Psoriasis ?Hands ?Psoriasis with overlay of Atopic Dermatitis - Improved ?Psoriasis is a chronic non-curable, but treatable genetic/hereditary disease that may have other systemic features affecting other organ systems such as joints (Psoriatic Arthritis). It is associated with an increased risk of inflammatory bowel disease, heart disease, non-alcoholic fatty liver disease, and depression.   ? ?Continue : ?Tacrolimus 0.1% (tube) oint daily,  ?TMC 0.1% cream (jar) daily up to 4 days per week as needed flares. Advised patient to use  ?Tacrolimus for any rash of hands and knees.  ?Triamcinolone 0.1% cream can be used up to 4 days per week if areas are not clearing with the Tacrolimus. ?Chronic and persistent condition with duration or expected duration over one year. Condition is symptomatic / bothersome to patient. Not to goal. ? ?Related Medications ?triamcinolone ointment (KENALOG) 0.1 % ?Apply 1 application. topically as directed. Qhs to affected areas up to 4 times per week as needed ? ?Rosacea ?Head - Anterior  (Face) ?Rosacea is a chronic progressive skin condition usually affecting the face of adults, causing redness and/or acne bumps. It is treatable but not curable. It sometimes affects the eyes (ocular rosacea) as well. It may respond to topical and/or systemic medication and can flare with stress, sun exposure, alcohol, exercise and some foods.  Daily application of broad spectrum spf 30+ sunscreen to face is recommended to reduce flares. ? ?Discussed the treatment option of BBL/laser.  Typically we recommend 1-3 treatment sessions about 5-8 weeks apart for best results.  The patient's condition may require "maintenance treatments" in the future.  The fee for BBL / laser treatments is $350 per treatment session for the whole face.  A fee can be quoted for other parts of the body. ?Insurance typically does not pay for BBL/laser treatments and therefore the fee is an out-of-pocket cost. ? ?Return in about 1 year (around 08/16/2022). ? ?I, Ashok Cordia, CMA, am acting as scribe for Sarina Ser, MD . ?Documentation: I have reviewed the above documentation for accuracy and completeness, and I agree with the above. ? ?Sarina Ser, MD ? ?

## 2021-08-15 NOTE — Patient Instructions (Signed)
Continue Tacrolimus 0.1% (tube) oint daily, TMC 0.1% cream (jar) daily up to 4 days per week as needed flares. Advised patient to use Tacrolimus for any rash of hands and knees. Triamcinolone 0.1% cream can be used up to 4 days per week if areas are not clearing with the Tacrolimus. ? ? ?If You Need Anything After Your Visit ? ?If you have any questions or concerns for your doctor, please call our main line at [phone] and press option 4 to reach your doctor's medical assistant. If no one answers, please leave a voicemail as directed and we will return your call as soon as possible. Messages left after 4 pm will be answered the following business day.  ? ?You may also send [place] a message via MyChart. We typically respond to MyChart messages within 1-2 business days. ? ?For prescription refills, please ask your pharmacy to contact our office. Our fax number is [phone]. ? ?If you have an urgent issue when the clinic is closed that cannot wait until the next business day, you can page your doctor at the number below.   ? ?Please note that while we do our best to be available for urgent issues outside of office hours, we are not available 24/7.  ? ?If you have an urgent issue and are unable to reach [place], you may choose to seek medical care at your doctor's office, retail clinic, urgent care center, or emergency room. ? ?If you have a medical emergency, please immediately call 911 or go to the emergency department. ? ?Pager Numbers ? ?- Dr. Nehemiah Massed: [phone] ? ?- Dr. Laurence Ferrari: [phone] ? ?- Dr. Nicole Kindred: [phone] ? ?In the event of inclement weather, please call our main line at [phone] for an update on the status of any delays or closures. ? ?Dermatology Medication Tips: ?Please keep the boxes that topical medications come in in order to help keep track of the instructions about where and how to use these. Pharmacies typically print the medication instructions only on the boxes and not directly on the  medication tubes.  ? ?If your medication is too expensive, please contact our office at [phone] option 4 or send [place] a message through [place].  ? ?We are unable to tell what your co-pay for medications will be in advance as this is different depending on your insurance coverage. However, we may be able to find a substitute medication at lower cost or fill out paperwork to get insurance to cover a needed medication.  ? ?If a prior authorization is required to get your medication covered by your insurance company, please allow [place] 1-2 business days to complete this process. ? ?Drug prices often vary depending on where the prescription is filled and some pharmacies may offer cheaper prices. ? ?The website www.goodrx.com contains coupons for medications through different pharmacies. The prices here do not account for what the cost may be with help from insurance (it may be cheaper with your insurance), but the website can give you the price if you did not use any insurance.  ?- You can print the associated coupon and take it with your prescription to the pharmacy.  ?- You may also stop by our office during regular business hours and pick up a GoodRx coupon card.  ?- If you need your prescription sent electronically to a different pharmacy, notify our office through [facility] or by phone at [phone] option 4. ? ? ? ? ?Si Usted Necesita Algo Despu?s de Su Visita ? ?Tambi?n  puede enviarnos un mensaje a trav?s de MyChart. Por lo general respondemos a los mensajes de MyChart en el transcurso de 1 a 2 d?as h?biles. ? ?Para renovar recetas, por favor pida a su farmacia que se ponga en contacto con nuestra oficina. Nuestro n?mero de fax es el [phone]. ? ?Si tiene un asunto urgente cuando la cl?nica est? cerrada y que no puede esperar hasta el siguiente d?a h?bil, puede llamar/localizar a su doctor(a) al n?mero que aparece a continuaci?n.  ? ?Por favor, tenga en cuenta que aunque hacemos todo lo posible  para estar disponibles para asuntos urgentes fuera del horario de oficina, no estamos disponibles las 24 horas del d?a, los 7 d?as de la semana.  ? ?Si tiene un problema urgente y no puede comunicarse con nosotros, puede optar por buscar atenci?n m?dica  en el consultorio de su doctor(a), en una cl?nica privada, en un centro de atenci?n urgente o en una sala de emergencias. ? ?Si tiene Engineer, maintenance (IT) m?dica, por favor llame inmediatamente al 911 o vaya a la sala de emergencias. ? ?N?meros de b?per ? ?- Dr. Nehemiah Massed: [phone] ? ?- Dra. Moye: [phone] ? ?- Dra. Nicole Kindred: [phone] ? ?En caso de inclemencias del tiempo, por favor llame a nuestra l?nea principal al [phone] para una actualizaci?n sobre el estado de cualquier retraso o cierre. ? ?Consejos para la medicaci?n en dermatolog?a: ?Por favor, guarde las cajas en las que vienen los medicamentos de uso t?pico para ayudarle a seguir las instrucciones sobre d?nde y c?mo usarlos. Las farmacias generalmente imprimen las instrucciones del medicamento s?lo en las cajas y no directamente en los tubos del [place].  ? ?Si su medicamento es muy caro, por favor, p?ngase en contacto con Zigmund Daniel llamando al [phone] y presione la opci?n 4 o env?enos un mensaje a trav?s de MyChart.  ? ?No podemos decirle cu?l ser? su copago por los medicamentos por adelantado ya que esto es diferente dependiendo de la cobertura de su seguro. Sin embargo, es posible que podamos encontrar un medicamento sustituto a Electrical engineer un formulario para que el seguro cubra el medicamento que se considera necesario.  ? ?Si se requiere Ardelia Mems autorizaci?n previa para que su compa??a de seguros [place] su medicamento, por favor perm?tanos de 1 a 2 d?as h?biles para completar este proceso. ? ?Los precios de los medicamentos var?an con frecuencia dependiendo del Environmental consultant de d?nde se surte la receta y alguna farmacias pueden ofrecer precios m?s baratos. ? ?El sitio web  www.goodrx.com tiene cupones para medicamentos de Airline pilot. Los precios aqu? no tienen en cuenta lo que podr?a costar con la ayuda del seguro (puede ser m?s barato con su seguro), pero el sitio web puede darle el precio si no utiliz? ning?n seguro.  ?- Puede imprimir el cup?n correspondiente y llevarlo con su receta a la farmacia.  ?- Tambi?n puede pasar por nuestra oficina durante el horario de atenci?n regular y recoger una tarjeta de cupones de GoodRx.  ?- Si necesita que su receta se env?e electr?nicamente a Chiropodist, informe a nuestra oficina a trav?s de MyChart de [place] o por tel?fono llamando al [phone] y presione la opci?n 4.  ?

## 2021-08-25 ENCOUNTER — Encounter: Payer: Self-pay | Admitting: Dermatology

## 2022-06-26 ENCOUNTER — Encounter: Payer: Self-pay | Admitting: Radiology

## 2022-08-21 ENCOUNTER — Ambulatory Visit: Payer: Medicare HMO | Admitting: Dermatology

## 2022-08-21 ENCOUNTER — Encounter: Payer: Self-pay | Admitting: Dermatology

## 2022-08-21 DIAGNOSIS — L409 Psoriasis, unspecified: Secondary | ICD-10-CM | POA: Diagnosis not present

## 2022-08-21 DIAGNOSIS — L821 Other seborrheic keratosis: Secondary | ICD-10-CM | POA: Diagnosis not present

## 2022-08-21 DIAGNOSIS — L814 Other melanin hyperpigmentation: Secondary | ICD-10-CM

## 2022-08-21 DIAGNOSIS — Z79899 Other long term (current) drug therapy: Secondary | ICD-10-CM

## 2022-08-21 DIAGNOSIS — L820 Inflamed seborrheic keratosis: Secondary | ICD-10-CM

## 2022-08-21 DIAGNOSIS — L578 Other skin changes due to chronic exposure to nonionizing radiation: Secondary | ICD-10-CM

## 2022-08-21 DIAGNOSIS — Z1283 Encounter for screening for malignant neoplasm of skin: Secondary | ICD-10-CM

## 2022-08-21 DIAGNOSIS — Z7189 Other specified counseling: Secondary | ICD-10-CM

## 2022-08-21 DIAGNOSIS — L918 Other hypertrophic disorders of the skin: Secondary | ICD-10-CM

## 2022-08-21 DIAGNOSIS — D229 Melanocytic nevi, unspecified: Secondary | ICD-10-CM

## 2022-08-21 MED ORDER — TRIAMCINOLONE ACETONIDE 0.1 % EX OINT: 1.0000 | TOPICAL_OINTMENT | CUTANEOUS | 3 refills | Status: DC

## 2022-08-21 MED ORDER — TACROLIMUS 0.1 % EX OINT: TOPICAL_OINTMENT | Freq: Every day | CUTANEOUS | 3 refills | Status: AC

## 2022-08-21 NOTE — Patient Instructions (Signed)
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 Avoid applying to face, groin, and axilla. Use as directed. Long-term use can cause thinning of the skin.  Topical steroids (such as triamcinolone, fluocinolone, fluocinonide, mometasone, clobetasol, halobetasol, betamethasone, hydrocortisone) can cause thinning and lightening of the skin if they are used for too long in the same area. Your physician has selected the right strength medicine for your problem and area affected on the body. Please use your medication only as directed by your physician to prevent side effects.   Melanoma ABCDEs  Melanoma is the most dangerous type of skin cancer, and is the leading cause of death from skin disease.  You are more likely to develop melanoma if you: Have light-colored skin, light-colored eyes, or red or blond hair Spend a lot of time in the sun Tan regularly, either outdoors or in a tanning bed Have had blistering sunburns, especially during childhood Have a close family member who has had a melanoma Have atypical moles or large birthmarks  Early detection of melanoma is key since treatment is typically straightforward and cure rates are extremely high if we catch it early.   The first sign of melanoma is often a change in a mole or a new dark spot.  The ABCDE system is a way of remembering the signs of melanoma.  A for asymmetry:  The two halves do not match. B for border:  The edges of the growth are irregular. C for color:  A mixture of colors are present instead of an even brown color. D for diameter:  Melanomas are usually (but not always) greater than 6mm - the size of a pencil eraser. E for evolution:  The spot keeps changing in size, shape, and color.  Please check your skin once per month between visits. You can use a small mirror in front and a large mirror behind you to keep an eye on the back  side or your body.   If you see any new or changing lesions before your next follow-up, please call to schedule a visit.  Please continue daily skin protection including broad spectrum sunscreen SPF 30+ to sun-exposed areas, reapplying every 2 hours as needed when you're outdoors.    Due to recent changes in healthcare laws, you may see results of your pathology and/or laboratory studies on MyChart before the doctors have had a chance to review them. We understand that in some cases there may be results that are confusing or concerning to you. Please understand that not all results are received at the same time and often the doctors may need to interpret multiple results in order to provide you with the best plan of care or course of treatment. Therefore, we ask that you please give [place] 2 business days to thoroughly review all your results before contacting the office for clarification. Should we see a critical lab result, you will be contacted sooner.   If You Need Anything After Your Visit  If you have any questions or concerns for your doctor, please call our main line at [phone] and press option 4 to reach your doctor's medical assistant. If no one answers, please leave a voicemail as directed and we will return your call as soon as possible. Messages left after 4 pm will be answered the following business day.   You may also send [place] a message via MyChart. We typically  respond to MyChart messages within 1-2 business days.  For prescription refills, please ask your pharmacy to contact our office. Our fax number is [phone].  If you have an urgent issue when the clinic is closed that cannot wait until the next business day, you can page your doctor at the number below.    Please note that while we do our best to be available for urgent issues outside of office hours, we are not available 24/7.   If you have an urgent issue and are unable to reach [place], you may choose to seek medical care  at your doctor's office, retail clinic, urgent care center, or emergency room.  If you have a medical emergency, please immediately call 911 or go to the emergency department.  Pager Numbers  - Dr. Gwen Pounds: [phone]  - Dr. Neale Burly: [phone]  - Dr. Roseanne Reno: [phone]  In the event of inclement weather, please call our main line at [phone] for an update on the status of any delays or closures.  Dermatology Medication Tips: Please keep the boxes that topical medications come in in order to help keep track of the instructions about where and how to use these. Pharmacies typically print the medication instructions only on the boxes and not directly on the medication tubes.   If your medication is too expensive, please contact our office at [phone] option 4 or send [place] a message through MyChart.   We are unable to tell what your co-pay for medications will be in advance as this is different depending on your insurance coverage. However, we may be able to find a substitute medication at lower cost or fill out paperwork to get insurance to cover a needed medication.   If a prior authorization is required to get your medication covered by your insurance company, please allow [place] 1-2 business days to complete this process.  Drug prices often vary depending on where the prescription is filled and some pharmacies may offer cheaper prices.  The website www.goodrx.com contains coupons for medications through different pharmacies. The prices here do not account for what the cost may be with help from insurance (it may be cheaper with your insurance), but the website can give you the price if you did not use any insurance.  - You can print the associated coupon and take it with your prescription to the pharmacy.  - You may also stop by our office during regular business hours and pick up a GoodRx coupon card.  - If you need your prescription sent electronically to a different pharmacy,  notify our office through [facility] or by phone at [phone] option 4.

## 2022-08-21 NOTE — Progress Notes (Signed)
Follow-Up Visit   Subjective  Sheri Watts is a 82 y.o. female who presents for the following: Skin Cancer Screening and Upper Body Skin Exam The patient presents for Total-Body Skin Exam (TBSE) for skin cancer screening and mole check. The patient has spots, moles and lesions to be evaluated, some may be new or changing and the patient has concerns that these could be cancer.  The following portions of the chart were reviewed this encounter and updated as appropriate: medications, allergies, medical history  Review of Systems:  No other skin or systemic complaints except as noted in HPI or Assessment and Plan.  Objective  Well appearing patient in no apparent distress; mood and affect are within normal limits.  A full examination was performed including scalp, head, eyes, ears, nose, lips, neck, chest, axillae, abdomen, back, buttocks, bilateral upper extremities, bilateral lower extremities, hands, feet, fingers, toes, fingernails, and toenails. All findings within normal limits unless otherwise noted below.   Relevant physical exam findings are noted in the Assessment and Plan.  chest x 3 (3) Erythematous stuck-on, waxy papule or plaque   Assessment & Plan   LENTIGINES, SEBORRHEIC KERATOSES, HEMANGIOMAS - Benign normal skin lesions - Benign-appearing - Call for any changes  MELANOCYTIC NEVI - Tan-brown and/or pink-flesh-colored symmetric macules and papules - Benign appearing on exam today - Observation - Call clinic for new or changing moles - Recommend daily use of broad spectrum spf 30+ sunscreen to sun-exposed areas.   ACTINIC DAMAGE - Chronic condition, secondary to cumulative UV/sun exposure - diffuse scaly erythematous macules with underlying dyspigmentation - Recommend daily broad spectrum sunscreen SPF 30+ to sun-exposed areas, reapply every 2 hours as needed.  - Staying in the shade or wearing long sleeves, sun glasses (UVA+UVB protection) and wide brim hats  (4-inch brim around the entire circumference of the hat) are also recommended for sun protection.  - Call for new or changing lesions.  SKIN CANCER SCREENING PERFORMED TODAY.  PSORIASIS Exam: few scaly patches. Chronic condition with duration or expected duration over one year. Currently well-controlled. Psoriasis is a chronic non-curable, but treatable genetic/hereditary disease that may have other systemic features affecting other organ systems such as joints (Psoriatic Arthritis). It is associated with an increased risk of inflammatory bowel disease, heart disease, non-alcoholic fatty liver disease, and depression.  Treatments include light and laser treatments; topical medications; and systemic medications including oral and injectables.  Treatment Plan: Continue tacrolimus daily as needed Continue TMC 0.1 % cream daily up to 4 days a week as needed for flares. Avoid applying to face, groin, and axilla. Use as directed. Long-term use can cause thinning of the skin.  Acrochordons (Skin Tags) - Fleshy, skin-colored pedunculated papules - Benign appearing.  - Observe. - If desired, they can be removed with an in office procedure that is not covered by insurance. - Please call the clinic if you notice any new or changing lesions.  Inflamed seborrheic keratosis (3) chest x 3  Symptomatic, irritating, patient would like treated.   Destruction of lesion - chest x 3 Complexity: simple   Destruction method: cryotherapy   Informed consent: discussed and consent obtained   Timeout:  patient name, date of birth, surgical site, and procedure verified Lesion destroyed using liquid nitrogen: Yes   Region frozen until ice ball extended beyond lesion: Yes   Outcome: patient tolerated procedure well with no complications   Post-procedure details: wound care instructions given    Psoriasis  Related Medications triamcinolone ointment (KENALOG)  0.1 % Apply 1 Application topically as directed.  Qhs to affected areas up to 4 times per week as needed   Return in about 1 year (around 08/21/2023) for Psoriasis.  Anise Salvo, RMA, am acting as scribe for Armida Sans, MD .  Documentation: I have reviewed the above documentation for accuracy and completeness, and I agree with the above.  Armida Sans, MD

## 2022-08-29 ENCOUNTER — Encounter: Payer: Self-pay | Admitting: Dermatology

## 2022-09-25 ENCOUNTER — Other Ambulatory Visit (HOSPITAL_COMMUNITY): Payer: Self-pay | Admitting: Nurse Practitioner

## 2022-09-25 DIAGNOSIS — Z1231 Encounter for screening mammogram for malignant neoplasm of breast: Secondary | ICD-10-CM

## 2022-10-03 ENCOUNTER — Encounter (HOSPITAL_COMMUNITY): Payer: Self-pay

## 2022-10-03 ENCOUNTER — Ambulatory Visit (HOSPITAL_COMMUNITY)
Admission: RE | Admit: 2022-10-03 | Discharge: 2022-10-03 | Disposition: A | Discharge status: Home / Self Care | Payer: Medicare HMO | Source: Ambulatory Visit | Attending: Nurse Practitioner | Admitting: Nurse Practitioner

## 2022-10-03 DIAGNOSIS — Z1231 Encounter for screening mammogram for malignant neoplasm of breast: Secondary | ICD-10-CM | POA: Insufficient documentation

## 2022-10-06 ENCOUNTER — Inpatient Hospital Stay
Admission: RE | Admit: 2022-10-06 | Discharge: 2022-10-06 | Disposition: A | Discharge status: Home / Self Care | Payer: Self-pay | Source: Ambulatory Visit | Attending: Nurse Practitioner | Admitting: Nurse Practitioner

## 2022-10-06 ENCOUNTER — Other Ambulatory Visit (HOSPITAL_COMMUNITY): Payer: Self-pay | Admitting: Nurse Practitioner

## 2022-10-06 DIAGNOSIS — Z1231 Encounter for screening mammogram for malignant neoplasm of breast: Secondary | ICD-10-CM

## 2022-10-13 ENCOUNTER — Inpatient Hospital Stay
Admission: RE | Admit: 2022-10-13 | Discharge: 2022-10-13 | Disposition: A | Discharge status: Home / Self Care | Payer: Self-pay | Source: Ambulatory Visit | Attending: Nurse Practitioner | Admitting: Nurse Practitioner

## 2022-10-13 ENCOUNTER — Other Ambulatory Visit (HOSPITAL_COMMUNITY): Payer: Self-pay | Admitting: Nurse Practitioner

## 2022-10-13 ENCOUNTER — Encounter (HOSPITAL_COMMUNITY): Payer: Self-pay | Admitting: Nurse Practitioner

## 2022-10-13 DIAGNOSIS — Z1231 Encounter for screening mammogram for malignant neoplasm of breast: Secondary | ICD-10-CM

## 2022-10-16 ENCOUNTER — Encounter (HOSPITAL_COMMUNITY): Payer: Self-pay | Admitting: Nurse Practitioner

## 2022-10-16 ENCOUNTER — Other Ambulatory Visit (HOSPITAL_COMMUNITY): Payer: Self-pay | Admitting: Nurse Practitioner

## 2022-10-16 DIAGNOSIS — R928 Other abnormal and inconclusive findings on diagnostic imaging of breast: Secondary | ICD-10-CM

## 2022-10-19 LAB — EXTERNAL GENERIC LAB PROCEDURE: COLOGUARD: NEGATIVE

## 2022-10-19 LAB — COLOGUARD: COLOGUARD: NEGATIVE

## 2022-10-23 ENCOUNTER — Ambulatory Visit (HOSPITAL_COMMUNITY)
Admission: RE | Admit: 2022-10-23 | Discharge: 2022-10-23 | Disposition: A | Discharge status: Home / Self Care | Payer: Medicare HMO | Source: Ambulatory Visit | Attending: Nurse Practitioner | Admitting: Nurse Practitioner

## 2022-10-23 ENCOUNTER — Other Ambulatory Visit (HOSPITAL_COMMUNITY): Payer: Self-pay | Admitting: Nurse Practitioner

## 2022-10-23 DIAGNOSIS — R928 Other abnormal and inconclusive findings on diagnostic imaging of breast: Secondary | ICD-10-CM

## 2022-11-04 ENCOUNTER — Ambulatory Visit (HOSPITAL_COMMUNITY)
Admission: RE | Admit: 2022-11-04 | Discharge: 2022-11-04 | Disposition: A | Discharge status: Home / Self Care | Payer: Medicare HMO | Source: Ambulatory Visit | Attending: Nurse Practitioner | Admitting: Nurse Practitioner

## 2022-11-04 DIAGNOSIS — R928 Other abnormal and inconclusive findings on diagnostic imaging of breast: Secondary | ICD-10-CM | POA: Insufficient documentation

## 2022-11-04 MED ORDER — LIDOCAINE HCL (PF) 2 % IJ SOLN
INTRAMUSCULAR | Status: AC
  Filled 2022-11-04: qty 10

## 2023-08-27 ENCOUNTER — Ambulatory Visit: Payer: Medicare HMO | Admitting: Dermatology

## 2023-08-27 ENCOUNTER — Encounter: Payer: Self-pay | Admitting: Dermatology

## 2023-08-27 DIAGNOSIS — L820 Inflamed seborrheic keratosis: Secondary | ICD-10-CM

## 2023-08-27 DIAGNOSIS — L578 Other skin changes due to chronic exposure to nonionizing radiation: Secondary | ICD-10-CM

## 2023-08-27 DIAGNOSIS — L918 Other hypertrophic disorders of the skin: Secondary | ICD-10-CM

## 2023-08-27 DIAGNOSIS — L409 Psoriasis, unspecified: Secondary | ICD-10-CM | POA: Diagnosis not present

## 2023-08-27 DIAGNOSIS — Z7189 Other specified counseling: Secondary | ICD-10-CM

## 2023-08-27 DIAGNOSIS — Z1283 Encounter for screening for malignant neoplasm of skin: Secondary | ICD-10-CM | POA: Diagnosis not present

## 2023-08-27 DIAGNOSIS — Z79899 Other long term (current) drug therapy: Secondary | ICD-10-CM

## 2023-08-27 DIAGNOSIS — L719 Rosacea, unspecified: Secondary | ICD-10-CM

## 2023-08-27 DIAGNOSIS — D229 Melanocytic nevi, unspecified: Secondary | ICD-10-CM

## 2023-08-27 DIAGNOSIS — L209 Atopic dermatitis, unspecified: Secondary | ICD-10-CM

## 2023-08-27 DIAGNOSIS — D1801 Hemangioma of skin and subcutaneous tissue: Secondary | ICD-10-CM

## 2023-08-27 DIAGNOSIS — W908XXA Exposure to other nonionizing radiation, initial encounter: Secondary | ICD-10-CM

## 2023-08-27 DIAGNOSIS — L821 Other seborrheic keratosis: Secondary | ICD-10-CM

## 2023-08-27 DIAGNOSIS — D692 Other nonthrombocytopenic purpura: Secondary | ICD-10-CM

## 2023-08-27 DIAGNOSIS — L814 Other melanin hyperpigmentation: Secondary | ICD-10-CM

## 2023-08-27 MED ORDER — TRIAMCINOLONE ACETONIDE 0.1 % EX OINT
1.0000 | TOPICAL_OINTMENT | CUTANEOUS | 3 refills | Status: AC
Start: 2023-08-27 — End: ?

## 2023-08-27 NOTE — Progress Notes (Signed)
 Follow-Up Visit   Subjective  Sheri Watts is a 83 y.o. female who presents for the following: Skin Cancer Screening and Upper Body Skin Exam Hx of isks Some scale at left hand at fingers, using tmc cream as needed for flares but reports has improved and not using cream much. Also reports some spots under breast  The patient presents for Upper Body Skin Exam (UBSE) for skin cancer screening and mole check. The patient has spots, moles and lesions to be evaluated, some may be new or changing and the patient may have concern these could be cancer.  The following portions of the chart were reviewed this encounter and updated as appropriate: medications, allergies, medical history  Review of Systems:  No other skin or systemic complaints except as noted in HPI or Assessment and Plan.  Objective  Well appearing patient in no apparent distress; mood and affect are within normal limits.  All skin waist up examined. Relevant physical exam findings are noted in the Assessment and Plan.  right inframammary x 16, left inframammary x 9 (25) Erythematous stuck-on, waxy papule or plaque  Assessment & Plan   INFLAMED SEBORRHEIC KERATOSIS (25) right inframammary x 16, left inframammary x 9 (25) Symptomatic, irritating, patient would like treated. Destruction of lesion - right inframammary x 16, left inframammary x 9 (25) Complexity: simple   Destruction method: cryotherapy   Informed consent: discussed and consent obtained   Timeout:  patient name, date of birth, surgical site, and procedure verified Lesion destroyed using liquid nitrogen: Yes   Region frozen until ice ball extended beyond lesion: Yes   Outcome: patient tolerated procedure well with no complications   Post-procedure details: wound care instructions given   PSORIASIS   Related Medications triamcinolone  ointment (KENALOG ) 0.1 % Apply 1 Application topically as directed. Qhs to affected areas up to 4 times per week as  needed   Skin cancer screening performed today.  Actinic Damage - Chronic condition, secondary to cumulative UV/sun exposure - diffuse scaly erythematous macules with underlying dyspigmentation - Recommend daily broad spectrum sunscreen SPF 30+ to sun-exposed areas, reapply every 2 hours as needed.  - Staying in the shade or wearing long sleeves, sun glasses (UVA+UVB protection) and wide brim hats (4-inch brim around the entire circumference of the hat) are also recommended for sun protection.  - Call for new or changing lesions.  Lentigines, Seborrheic Keratoses, Hemangiomas - Benign normal skin lesions - Benign-appearing - Call for any changes  Melanocytic Nevi - Tan-brown and/or pink-flesh-colored symmetric macules and papules - Benign appearing on exam today - Observation - Call clinic for new or changing moles - Recommend daily use of broad spectrum spf 30+ sunscreen to sun-exposed areas.   Acrochordons (Skin Tags) - Fleshy, skin-colored pedunculated papules - Benign appearing.  - Observe. - If desired, they can be removed with an in office procedure that is not covered by insurance. - Please call the clinic if you notice any new or changing lesions.  Purpura - Chronic; persistent and recurrent.  Treatable, but not curable. 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 Chronic and persistent condition with duration  or expected duration over one year. Condition is improving with treatment but not currently at goal. patient denies joint pain   Psoriasis is a chronic non-curable, but treatable genetic/hereditary  disease that may have other systemic features affecting other organ systems such as joints (Psoriatic Arthritis). It is associated with an increased risk of inflammatory bowel disease, heart disease, non-alcoholic fatty liver disease, and depression.  Treatments include light and laser treatments; topical medications; and systemic medications including oral and injectables.  Treatment Plan: Continue TMC 0.1 % cream daily up to 4 days a week as needed for flares. Avoid applying to face, groin, and axilla. Use as directed. Long-term use can cause thinning of the skin.   Topical steroids (such as triamcinolone , fluocinolone, fluocinonide, mometasone, clobetasol, halobetasol, betamethasone, hydrocortisone) can cause thinning and lightening of the skin if they are used for too long in the same area. Your physician has selected the right strength medicine for your problem and area affected on the body. Please use your medication only as directed by your physician to prevent side effects.  Chronic and persistent condition with duration or expected duration over one year. Condition is symptomatic / bothersome to patient. Not to goal.  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 Condition is symptomatic / bothersome to patient. Not to goal. Rosacea is a chronic progressive skin condition usually affecting the face of adults, causing redness and/or acne bumps. It is treatable but not curable. It sometimes affects the eyes (ocular rosacea) as well. It may respond to topical and/or systemic medication and can flare with stress, sun exposure, alcohol, exercise, topical steroids (including hydrocortisone/cortisone 10) and some foods.  Daily application of broad spectrum spf 30+ sunscreen to face is recommended to reduce flares.  Patient denies grittiness of the eyes   Treatment  Plan Patient declines treatment If patient changes mind will prescribe metrogel 0.75 bid to aa's    Return in about 1 year (around 08/26/2024) for psoriasis and ubse .  IRandee Busing, CMA, am acting as scribe for Celine Collard, MD.   Documentation: I have reviewed the above documentation for accuracy and completeness, and I agree with the above.  Celine Collard, MD

## 2023-08-27 NOTE — Patient Instructions (Addendum)

## 2023-09-25 ENCOUNTER — Other Ambulatory Visit (HOSPITAL_COMMUNITY): Payer: Self-pay | Admitting: Nurse Practitioner

## 2023-09-25 DIAGNOSIS — Z13820 Encounter for screening for osteoporosis: Secondary | ICD-10-CM

## 2023-09-25 DIAGNOSIS — Z1231 Encounter for screening mammogram for malignant neoplasm of breast: Secondary | ICD-10-CM

## 2023-10-14 ENCOUNTER — Ambulatory Visit (HOSPITAL_COMMUNITY)
Admission: RE | Admit: 2023-10-14 | Discharge: 2023-10-14 | Disposition: A | Discharge status: Home / Self Care | Source: Ambulatory Visit | Attending: Nurse Practitioner | Admitting: Nurse Practitioner

## 2023-10-14 DIAGNOSIS — Z1231 Encounter for screening mammogram for malignant neoplasm of breast: Secondary | ICD-10-CM | POA: Insufficient documentation

## 2023-10-14 DIAGNOSIS — M810 Age-related osteoporosis without current pathological fracture: Secondary | ICD-10-CM | POA: Insufficient documentation

## 2023-10-14 DIAGNOSIS — Z13820 Encounter for screening for osteoporosis: Secondary | ICD-10-CM | POA: Insufficient documentation

## 2023-12-21 NOTE — Progress Notes (Signed)
 Referring Provider: Pecolia Garnette SQUIBB, MD [address] [facility] [place],  [place] [address]  Primary Provider: Shona Norleen Folk, MD [address] [place] [address]  Other Providers:    Reason for Visit: Sheri Watts is a 83 y.o. female being seen for routine visit and continued care of her dual chamber pacemaker .  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 Longest<1hr Battery - 5.8 years Underlying Rhythm - SB Device Dependent -  no Continue remotes   2. Paroxysmal Atrial Fibrillation CHA2DS2-VASc - 4 (age2, sex, htn) Eliquis 5mg  bid - AT/AF burden <1% - labs followed by PCP's office - metoprolol XL 25mg  daily   3. HTN BP: 144/56  - on hydrochlorothiazide 25mg  daily, lisinoipril 10mg  daily  Follow-up: Return in about 1 year (around 12/20/2024) for annual device.  History of Present Illness: Sheri Watts is a 82 y.o. female with a past medical history of HTN, HLD, with symptomatic intermittent complete AV block. Now s/p dual chamber pacemaker.    Interval History: Presents today for annual device follow up. No device related concerns/complaints.  Her activity is limited by knee pain. She hasn't worked in her flower beds like she should this year. Doesn't do a lot of walking. Has a pedal thing on the floor, uses it sometimes for 20 min. Does have some swelling, wears compression socks. Started hydrochlorothiazide  recently for BP. Hasn't noticed improvement in her swelling since starting It. Feels compression helps the most. Will see her PCP soon for lab work an annual physical.   Cardiovascular History & Procedures:  Cath / PCI: *  CV Surgery:  *  EP Procedures and Devices: 04/13/2018 Medtronic Dual Chamber Pacemaker *  Non-Invasive  Evaluation(s): Echo: 02/24/2018 TTE -  Normal left ventricular systolic function, EF 60-65% Normal right ventricular systolic function Tricuspid regurgitation - mild Elevated pulmonary artery systolic pressure - mild Dilated right atrium - moderate Mildly elevated right atrial pressure  12/04/22 1. The left ventricle is normal in size with normal wall thickness.   2. The left ventricular systolic function is normal, LVEF is visually estimated at > 55%.   3. The mitral valve leaflets are mildly thickened with normal leaflet mobility.   4. There is mild mitral valve regurgitation.   5. The aortic valve is trileaflet with mildly thickened leaflets with normal excursion.   6. There is mild aortic regurgitation.   7. The left atrium is moderately dilated in size.   8. The right ventricle is normal in size, with normal systolic function.    Cardiac CT/MRI/Nuclear Tests:  *           Other Past Medical History: See below for the complete EPIC list of past medical and surgical history.    Allergies: Bactrim [sulfamethoxazole-trimethoprim], Ciprofloxacin, Keflex [cephalexin], and Latex  Current Medications:  Current Outpatient Medications on File Prior to Visit  Medication Sig  . atorvastatin (LIPITOR) 40 MG tablet Take 1 tablet (40 mg total) by mouth daily.  . calcium carbonate-vitamin D3 600 mg-10 mcg (400 unit) Chew Chew 1 tablet daily.  . coenzyme Q10 200 mg capsule Take 1 capsule (200 mg total) by mouth daily.  . diclofenac sodium (VOLTAREN) 1 % gel   . ELIQUIS 5 mg Tab TAKE ONE TABLET  BY MOUTH TWICE DAILY  . hydroCHLOROthiazide (HYDRODIURIL) 25 MG tablet Take 1 tablet (25 mg total) by mouth daily.  SABRA lisinopril (PRINIVIL,ZESTRIL) 10 MG tablet Take 1 tablet (10 mg total) by mouth daily.  . metoPROLOL succinate (TOPROL-XL) 25 MG 24 hr tablet TAKE ONE TABLET BY MOUTH ONCE DAILY  . multivit with calcium,iron,min [facility] DAILY MULTIVITAMIN ORAL) Take 1 tablet by mouth daily.  SABRA  omega-3/dha/epa/fish oil (FISH OIL-OMEGA-3 FATTY ACIDS) 300-1,000 mg capsule Take 2 capsules (2 g total) by mouth daily.  . triamcinolone  (KENALOG ) 0.1 % ointment Apply 1 Application topically two (2) times a day.   No current facility-administered medications on file prior to visit.    Family History: The patient's family history includes Breast cancer in her mother.  Social history: She  reports that she has never smoked. She has never used smokeless tobacco.  Review of Systems: As per HPI and as follows.  Rest of the review of ten systems is negative or unremarkable except as stated above.  Physical Exam: VITAL SIGNS:  Vitals:   12/21/23 1126  BP: 144/56  Pulse: 61  SpO2: 98%   Wt Readings from Last 3 Encounters:  12/21/23 77.8 kg (171 lb 9.6 oz)  12/10/23 78.6 kg (173 lb 4.5 oz)  12/18/22 79.7 kg (175 lb 9.6 oz)     Today's Body mass index is 26.87 kg/m.   Height: 170.2 cm (5' 7.01)  GENERAL: well-appearing in no acute distress HEENT: Normocephalic and atraumatic. Conjunctivae and sclerae clear and anicteric.   NECK: Supple,   CARDIOVASCULAR: Rate and rhythm are regular.  No rubs or murmurs. RESPIRATORY: Normal respiratory effort. There are no wheezes. ABDOMEN: Soft, non-tender, Abdomen nondistended.   EXTREMITIES: There is 1+ pitting pedal/pretibial edema, bilaterally to ankles.  SKIN: No rashes, ecchymosis or petechiae. Warm, well perfused. Well healed left sided pacemaker scar  NEURO/PSYCH: Alert and oriented x 3. Affect appropriate. Nonfocal  Pertinent Laboratory Studies:  Clinical Support on 12/21/2023  Component Date Value Ref Range Status  . EKG Ventricular Rate 12/21/2023 60  BPM Final  . EKG Atrial Rate 12/21/2023 60  BPM Final  . EKG P-R Interval 12/21/2023 230  ms Final  . EKG QRS Duration 12/21/2023 102  ms Final  . EKG Q-T Interval 12/21/2023 416  ms Final  . EKG QTC Calculation 12/21/2023 416  ms Final  . EKG Calculated P Axis 12/21/2023 111   degrees Final  . EKG Calculated R Axis 12/21/2023 88  degrees Final  . EKG Calculated T Axis 12/21/2023 59  degrees Final  . QTC Fredericia 12/21/2023 416  ms Final  Orders Only on 10/11/2023  Component Date Value Ref Range Status  . Session datetime 10/11/2023 2023-10-11 03:00:06.0   Final  . Session type 10/11/2023 Remote Scheduled   Final  . Manufacturer 10/11/2023 STJ   Final  . Device type 10/11/2023 IPG   Final  . Model number 10/11/2023 2272 Assurity MRI(TM)   Final  . Serial number 10/11/2023 [id number]   Final  . Implant date 10/11/2023 2018-04-13   Final  . Battery remaining percentage 10/11/2023 54.00   Final  . Battery remaining longevity 10/11/2023 72.0   Final  . Battery voltage 10/11/2023 2.990   Final  . Battery replacement trigger 10/11/2023 2.600   Final  . Battery status 10/11/2023 Middle of Service   Final  . Atrial pacing percent 10/11/2023 50.00   Final  . RV pacing percent 10/11/2023 1.00   Final  . AT burden  percent 10/11/2023 1.00   Final  . RA intrinsic amplitude 10/11/2023 4.900   Final  . RA programmed sensitivity 10/11/2023 0.50   Final  . RA impedance 10/11/2023 600   Final  . RA threshold amplitude 10/11/2023 0.375   Final  . RA threshold pulsewidth 10/11/2023 0.4   Final  . RA threshold date 10/11/2023 2023-10-11   Final  . RA pacing amplitude 10/11/2023 1.750   Final  . RA pacing pulsewidth 10/11/2023 0.4   Final  . RV intrinsic amplitude 10/11/2023 12.000   Final  . RV programmed sensitivity 10/11/2023 2.00   Final  . RV impedance 10/11/2023 610   Final  . RV threshold amplitude 10/11/2023 0.625   Final  . RV threshold pulsewidth 10/11/2023 0.4   Final  . RV threshold date 10/11/2023 2023-10-11   Final  . RV pacing amplitude 10/11/2023 0.875   Final  . RV pacing pulsewidth 10/11/2023 0.4   Final  . Nola mode 10/11/2023 DDD   Final  . Lower rate 10/11/2023 60   Final  . Mode switch rate 10/11/2023 180   Final  . Upper tracking rate 10/11/2023 130    Final  . Upper sensor rate 10/11/2023 130   Final  . Paced AV delay 10/11/2023 250   Final  . Sensed AV delay 10/11/2023 250   Final    Lab Results  Component Value Date   Creatinine 0.78 04/13/2018   BUN 20 04/13/2018   Sodium 139 04/13/2018   Potassium 4.4 04/13/2018   CO2 26.0 04/13/2018    No results found for: DIGOXIN  Lab Results  Component Value Date   Cholesterol, Total 139 09/09/2022   Triglycerides 63 09/09/2022   Cholesterol, HDL 59 09/09/2022   Cholesterol, Non-HDL, Calculated 80 09/09/2022   Cholesterol, LDL, Calculated 66 09/09/2022    Lab Results  Component Value Date   WBC 4.7 04/13/2018   HGB 12.2 04/13/2018   HCT 38.0 04/13/2018   Platelet 178 04/13/2018     Past Medical History:  Diagnosis Date  . A-fib      . AVB (atrioventricular block)   . Essential hypertension   . Hyperlipidemia     Past Surgical History:  Procedure Laterality Date  . PR INSER HART PACER XVENOUS ATR/VENTR N/A 04/13/2018   [employer] Assurity MRI Pacemaker

## 2024-03-08 DIAGNOSIS — N1831 Chronic kidney disease, stage 3a: Secondary | ICD-10-CM | POA: Diagnosis not present

## 2024-04-15 ENCOUNTER — Telehealth: Payer: Self-pay | Admitting: Student in an Organized Health Care Education/Training Program

## 2024-04-15 NOTE — Telephone Encounter (Signed)
 Pts states she spoke with company for device monitoring and was told her provider would have to go to [email or web address] and request monitor history. Please advise.

## 2024-04-15 NOTE — Telephone Encounter (Signed)
 Please see 2 EPIC noted from today about remote monitoring.

## 2024-04-19 NOTE — Telephone Encounter (Signed)
 Spoke with pt current device clinic and let them know we will take over device once pt is seen here with us 

## 2024-05-31 ENCOUNTER — Encounter: Payer: Self-pay | Admitting: *Deleted

## 2024-05-31 NOTE — Progress Notes (Signed)
 Sheri Watts                                          MRN: [id number]   05/31/2024   The [facility] Quality Team Specialist reviewed this patient medical record for the purposes of chart review for care gap closure. The following were reviewed: chart review for care gap closure-controlling blood pressure.    [facility] Quality Team

## 2024-06-24 ENCOUNTER — Ambulatory Visit: Admitting: Student in an Organized Health Care Education/Training Program

## 2024-08-30 ENCOUNTER — Ambulatory Visit: Admitting: Dermatology
